# Patient Record
Sex: Male | Born: 1960 | Race: Black or African American | Hispanic: No | Marital: Married | Smoking: Never smoker
Health system: Southern US, Community
[De-identification: ages and names within clinical notes are randomized; demographics above are authoritative.]

## PROBLEM LIST (undated history)

## (undated) DIAGNOSIS — E785 Hyperlipidemia, unspecified: Secondary | ICD-10-CM

## (undated) HISTORY — DX: Hyperlipidemia, unspecified: E78.5

---

## 2011-10-24 ENCOUNTER — Telehealth: Payer: Self-pay | Admitting: *Deleted

## 2011-10-24 ENCOUNTER — Encounter: Payer: Self-pay | Admitting: Family Medicine

## 2011-10-24 ENCOUNTER — Ambulatory Visit (INDEPENDENT_AMBULATORY_CARE_PROVIDER_SITE_OTHER): Payer: Self-pay | Admitting: Family Medicine

## 2011-10-24 ENCOUNTER — Ambulatory Visit (INDEPENDENT_AMBULATORY_CARE_PROVIDER_SITE_OTHER): Payer: BC Managed Care – PPO

## 2011-10-24 VITALS — BP 137/86 | HR 63 | Ht 72.0 in | Wt 216.0 lb

## 2011-10-24 DIAGNOSIS — R351 Nocturia: Secondary | ICD-10-CM | POA: Insufficient documentation

## 2011-10-24 DIAGNOSIS — Z0000 Encounter for general adult medical examination without abnormal findings: Secondary | ICD-10-CM

## 2011-10-24 DIAGNOSIS — M542 Cervicalgia: Secondary | ICD-10-CM

## 2011-10-24 DIAGNOSIS — M5030 Other cervical disc degeneration, unspecified cervical region: Secondary | ICD-10-CM

## 2011-10-24 DIAGNOSIS — N529 Male erectile dysfunction, unspecified: Secondary | ICD-10-CM

## 2011-10-24 LAB — CBC WITH DIFFERENTIAL/PLATELET
Basophils Relative: 1 % (ref 0–1)
HCT: 44.5 % (ref 39.0–52.0)
Hemoglobin: 15.3 g/dL (ref 13.0–17.0)
Lymphs Abs: 1.5 10*3/uL (ref 0.7–4.0)
MCH: 30.5 pg (ref 26.0–34.0)
MCHC: 34.4 g/dL (ref 30.0–36.0)
Monocytes Absolute: 0.3 10*3/uL (ref 0.1–1.0)
Monocytes Relative: 10 % (ref 3–12)
Neutro Abs: 1.3 10*3/uL — ABNORMAL LOW (ref 1.7–7.7)

## 2011-10-24 LAB — HEMOGLOBIN A1C: Mean Plasma Glucose: 108 mg/dL (ref <117)

## 2011-10-24 IMAGING — CR DG CERVICAL SPINE COMPLETE 4+V
5 series · 5 of 5 positions shown · non-contrast
Comparison: None.

CLINICAL DATA: Neck pain for 6 months, no acute injury

CERVICAL SPINE - COMPLETE 4+ VIEW

[view not recorded (1 of 5)]
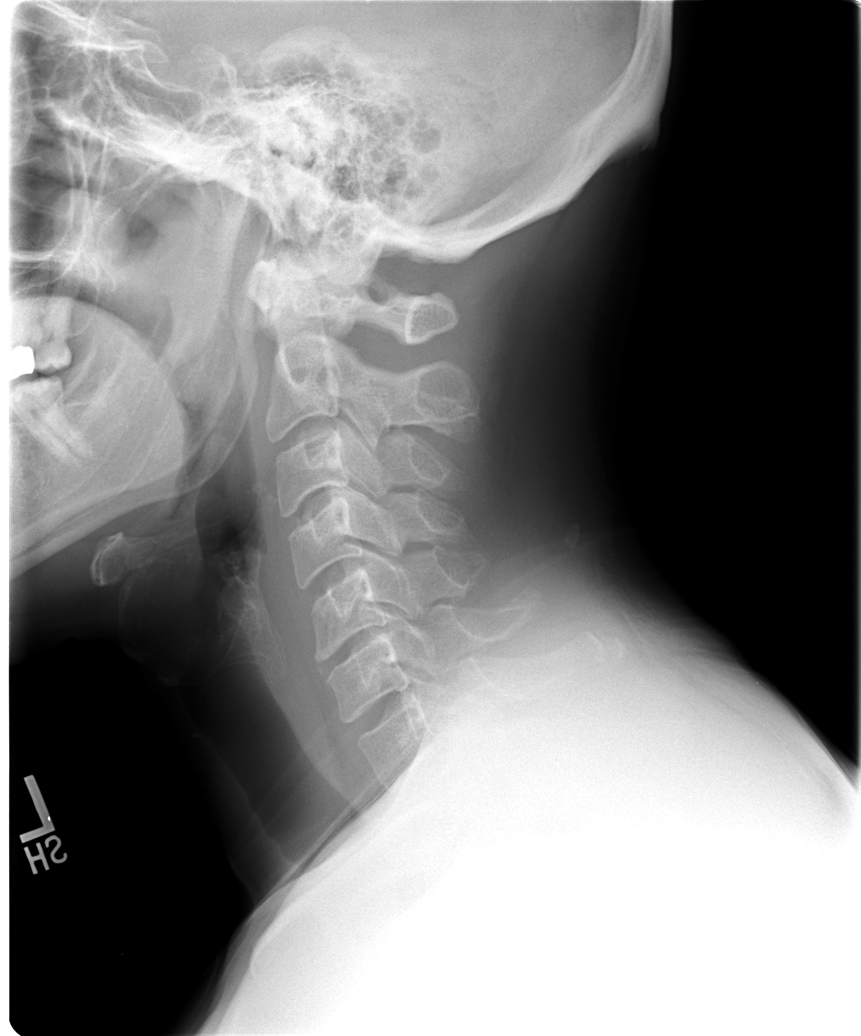

[view not recorded (2 of 5)]
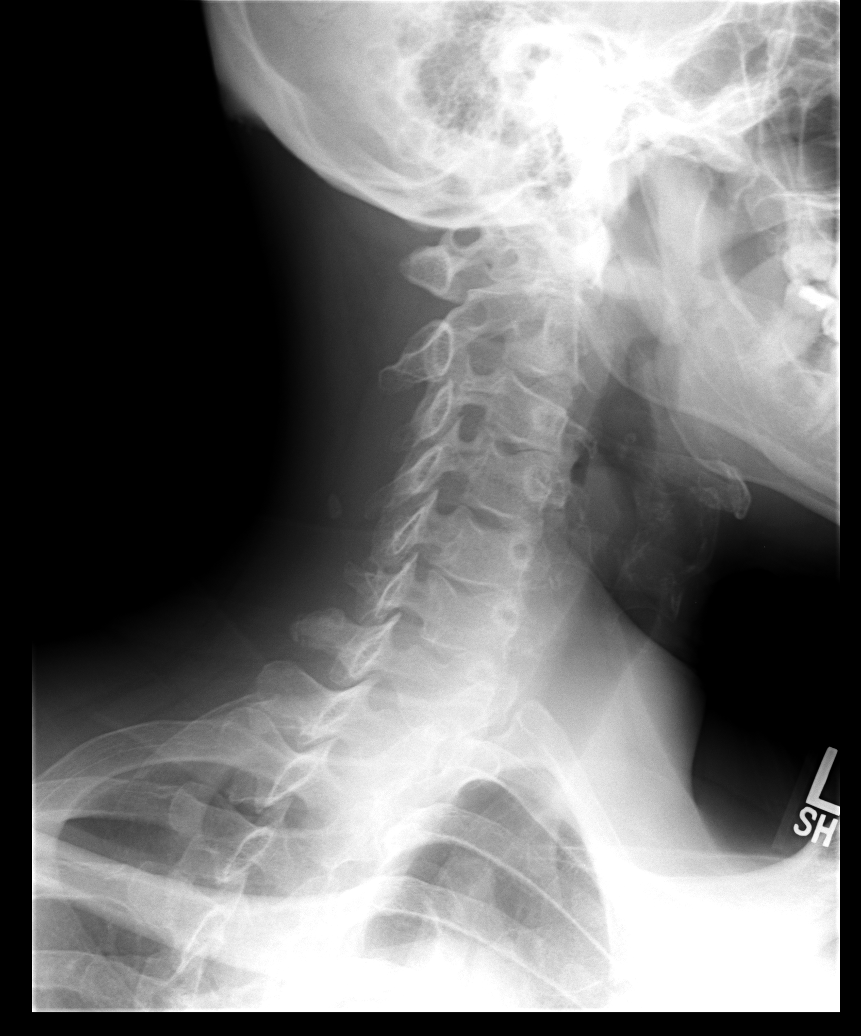

[view not recorded (3 of 5)]
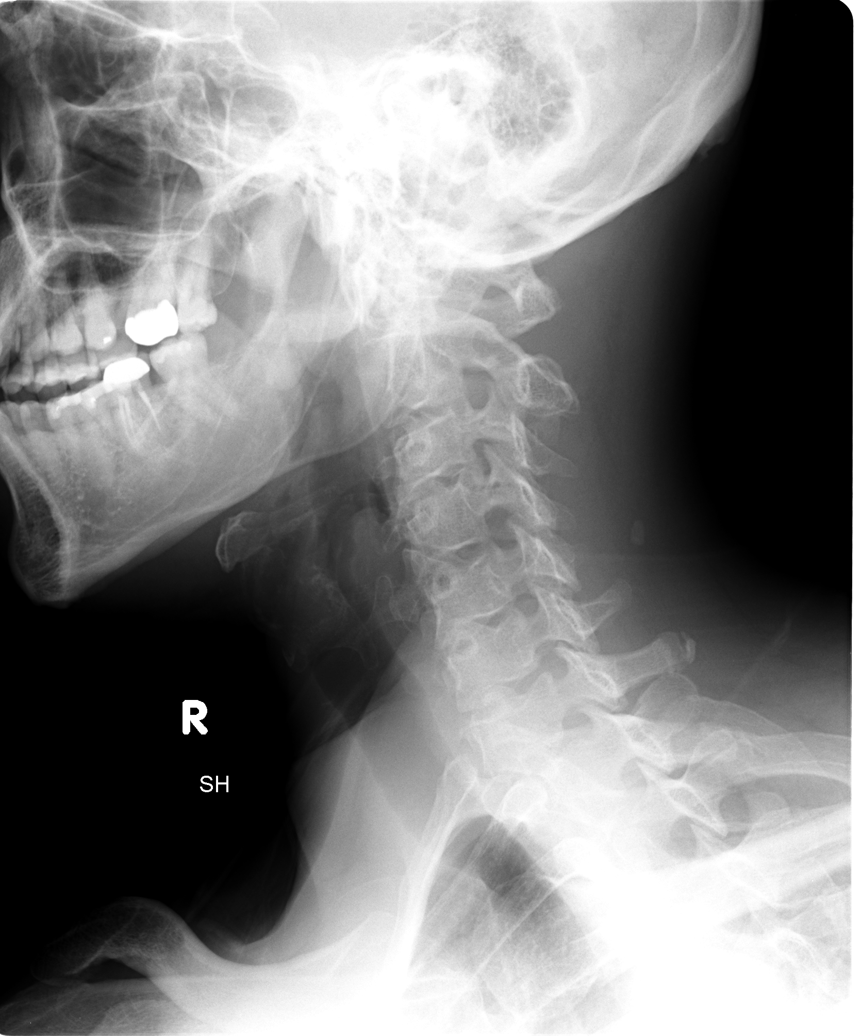

[view not recorded (4 of 5)]
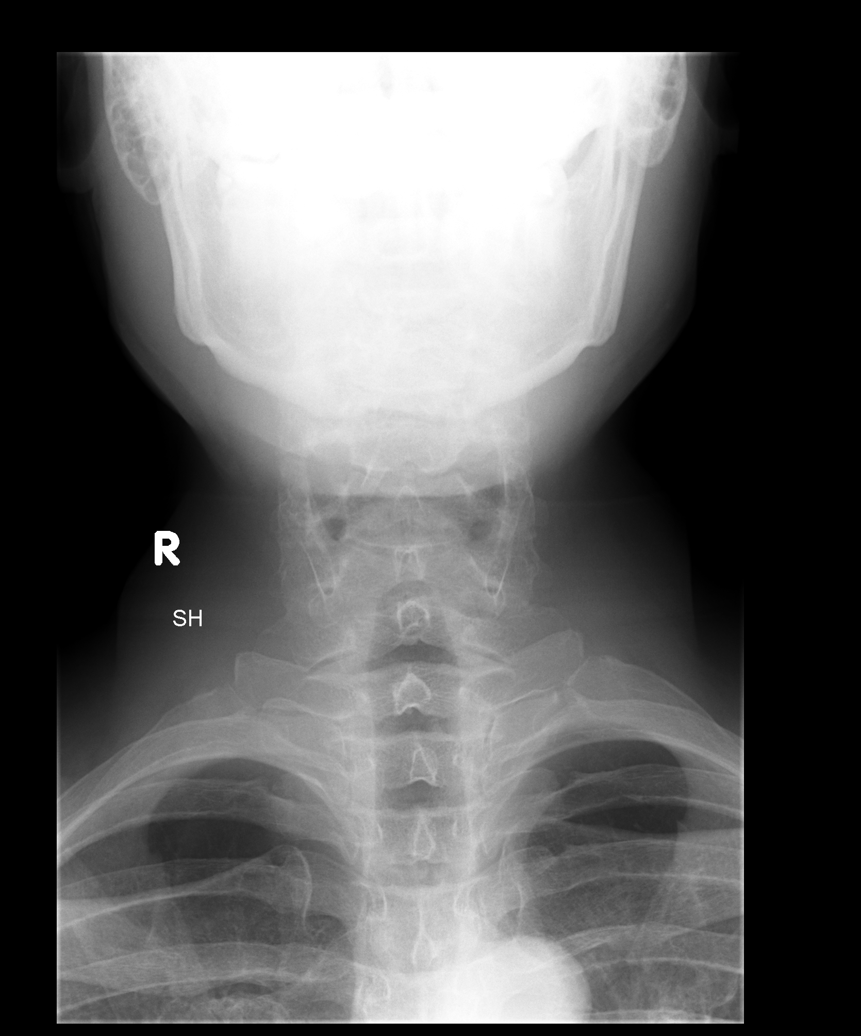

[view not recorded (5 of 5)]
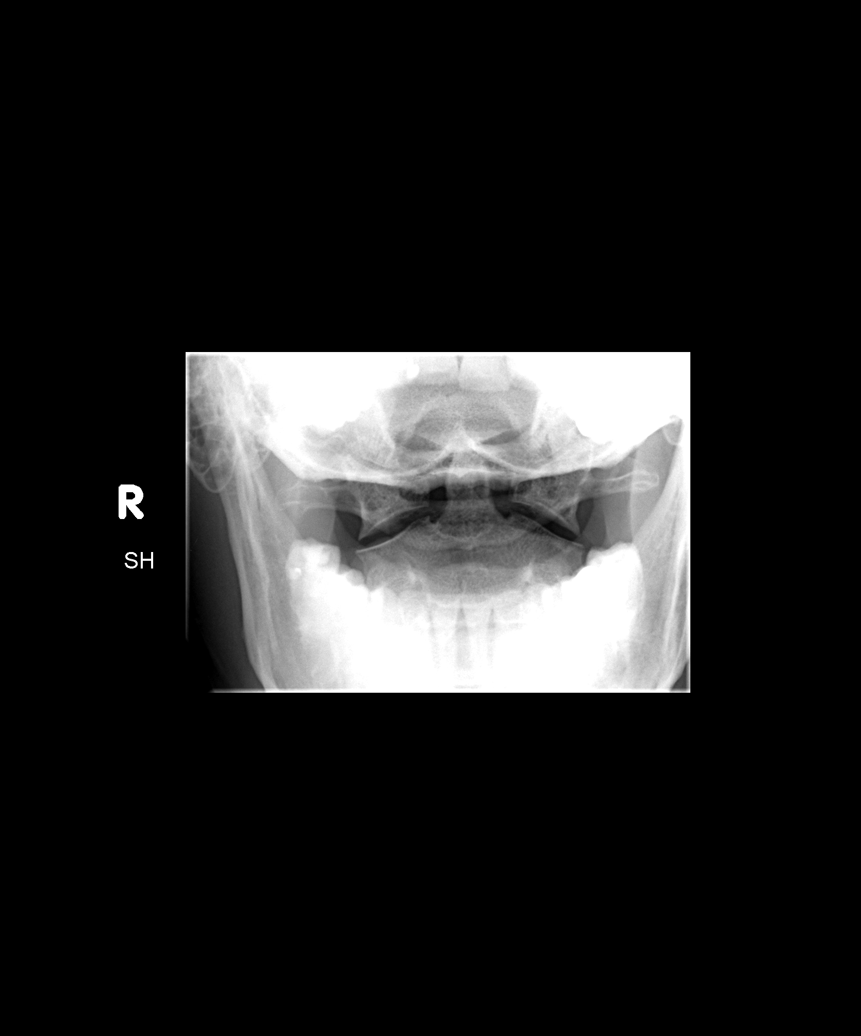

[5 of 5 positions shown; findings below may reference images not displayed]

FINDINGS: The cervical vertebrae are in normal alignment.  There is
mild degenerative disc disease at C5-6 with some loss of disc
space.  No prevertebral soft tissue swelling is seen.  On oblique
views, there is moderate foraminal narrowing at C5-6.  The odontoid
process is intact.  The lung apices are clear.
IMPRESSION: Normal alignment with mild degenerative disc disease at C5-6.

## 2011-10-24 MED ORDER — TADALAFIL 5 MG PO TABS: 5.0000 mg | ORAL_TABLET | Freq: Every day | ORAL | Status: DC | PRN

## 2011-10-24 MED ORDER — MELOXICAM 15 MG PO TABS: 15.0000 mg | ORAL_TABLET | Freq: Every day | ORAL | Status: DC

## 2011-10-24 NOTE — Patient Instructions (Signed)
Erectile Dysfunction Erectile dysfunction (ED) is the inability to get a good enough erection to have sexual intercourse. ED may involve:  Inability to get an erection.   Lack of enough hardness to allow penetration.   Loss of the erection before sex is finished.   Premature ejaculation.   Any combination of these problems if they occur more than 25% of the time.  CAUSES  Certain drugs, such as:   Pain relievers.   Antihistamines.   Antidepressants.   Blood pressure medicines.   Water pills.   Ulcer medicines.   Muscle relaxants.   Illegal drugs.   Excessive drinking.   Psychological causes, such as:   Anxiety.   Depression.   Sadness.   Exhaustion.   Performance fear.   Stress.   Physical causes, such as:   Artery problems. This may include diabetes, smoking, liver disease, or atherosclerosis.   High blood pressure.   Hormonal problems, such as low testosterone.   Obesity.   Nerve problems. This may include back or pelvic injuries, diabetes, multiple sclerosis, Parkinson's disease, or some surgeries.  SYMPTOMS  Inability to get an erection.   Lack of enough hardness to allow penetration.   Loss of the erection before sex is finished.   Premature ejaculation.   Normal erections at some times, but with frequent unsatisfactory episodes.   Orgasms that are not satisfactory in sensation or frequency.   Low sexual satisfaction in either partner because of erection problems.   A curved penis occurring with erection. The curve may cause pain or may be too curved to allow for intercourse.   Never having nighttime erections.  DIAGNOSIS Your caregiver can often diagnose this condition by:  Performing a physical exam to find other diseases or specific problems with the penis.   Asking you detailed questions about the problem.   Performing blood tests to check for diabetes or to measure hormone levels.   Performing urine tests to find other  underlying health conditions.   Performing an ultrasound to check for scarring.   Performing a test to check blood flow to the penis.   Doing a sleep study at home to measure nighttime erections.  TREATMENT   You may be prescribed medicines by mouth.   You may be given medicine injections into the penis.   You may be prescribed a vacuum pump with a ring.   Penile implant surgery may be performed. You may receive:   An inflatable implant.   A semi-rigid implant.   Blood vessel surgery may be performed.  HOME CARE INSTRUCTIONS  Take all medicine as directed by your caregiver. Do not take any other medicines without talking to your caregiver first.   Follow your caregiver's directions for specific treatments as prescribed.   Follow up with your caregiver as directed.  Document Released: 01/19/2000 Document Revised: 01/10/2011 Document Reviewed: 05/13/2010 [facility] Patient Information 2012 [place], [place].Benign Prostatic Hypertrophy  The prostate gland is part of the reproductive system of men. A normal prostate is about the size and shape of a walnut. The prostate gland makes a fluid that is mixed with sperm to make semen. This gland surrounds the urethra and is located in front of the rectum and just below the bladder. The bladder is where urine is stored. The urethra is the tube through which urine passes from the bladder to get out of the body. The prostate grows as a man ages. An enlarged prostate not caused by cancer is called benign prostatic hypertrophy (  BPH). This is a common health problem in men over age 32. This condition is a normal part of aging. An enlarged prostate presses on the urethra. This makes it harder to pass urine. In the early stages of enlargement, the bladder can get by with a narrowed urethra by forcing the urine through. If the problem gets worse, medical or surgical treatment may be required.  This condition should be followed by your caregiver.  Longstanding back pressure on the kidneys can cause infection. Back pressure and infection can progress to bladder damage and kidney (renal) failure. If needed, your caregiver may refer you to a specialist in kidney and prostate disease (urologist). CAUSES  The exact cause is not known.  SYMPTOMS   You are not able to completely empty your bladder.   Getting up often during the night to urinate.   Need to urinate frequently during the day.   Difficultly in starting urine flow.   Decrease in size and strength of the urine stream.   Dribbling after urination.   Pain on urination (more common with infection).   Inability to pass your water. This needs immediate treatment.  DIAGNOSIS  These tests will help your caregiver understand your problem:  Digital rectal exam (DRE). In a rectal exam, your caregiver checks your prostate by putting a gloved, lubricated finger into the rectum to feel the back of your prostate gland. This exam detects the size of the gland and abnormal lumps or growths.   Urinalysis (exam of the urine). This may include a culture if there is concern about infection.   Prostate Specific Antigen (PSA). This is a blood test used to screen for prostate cancer. It is not used alone for diagnosing prostate cancer.   Rectal ultrasound (sonogram). This test uses sound waves to electronically produce a "picture" of the prostate. It helps examine the prostate gland for cancer.  TREATMENT  Mild symptoms may not need treatment. Simple observation and yearly exams may be all that is required. Medications and surgery are options for more severe problems. Your caregiver can help you make an informed decision for what is best. Two classes of medications are available for relief of prostate symptoms:  Medications that shrink the prostate. This helps relieve symptoms.   Uncommon side effects include problems with sexual function.   Medications to relax the muscle of the prostate.  This also relieves the obstruction.   Side effects can include dizziness, fatigue, lightheadedness, and retrograde ejaculation (diminished volume of ejaculate).  Several types of surgical treatments are available for relief of prostate symptoms:  Transurethral resection of the prostate (TURP). In this treatment, an instrument is inserted through opening at the tip of the penis. It is used to cut away pieces of the inner core of the prostate. The pieces are removed through the same opening of the penis. This removes the obstruction and helps get rid of the symptoms.   Transurethral incision (TUIP). In this procedure, small cuts are made in the prostate. This lessens the prostates pressure on the urethra.   Transurethral microwave thermotherapy (TUMT). This procedure uses microwaves to create heat. The heat destroys and removes a small amount of prostate tissue.   Transurethral needle ablation (TUNA). This is a procedure that uses radio frequencies to do the same as TUMT.   Interstitial laser coagulation (ILC). This is a procedure that uses a laser to do the same as TUMT and TUNA.   Transurethral electrovaporization (TUVP). This is a procedure that uses electrodes  to do the same as the procedures listed above.  Regardless of the method of treatment chosen, you and your caregiver will discuss the options. With this knowledge, you along with your caregiver can decide upon the best treatment for you. SEEK MEDICAL CARE IF:   You develop chills, fever of 100.5 F (38.1 C), or night sweats.   There is unexplained back pain.   Symptoms are not helped by medications prescribed.   You develop medication side effects.   Your urine becomes very dark or has a bad smell.  SEEK IMMEDIATE MEDICAL CARE IF:   You are suddenly unable to urinate. This is an emergency. You should be seen immediately.   There are large amounts of blood or clots in the urine.   Your urinary problems become unmanageable.     You develop lightheadedness, severe dizziness, or you feel faint.   You develop moderate to severe low back or flank pain.   You develop chills or fever.  Document Released: 01/21/2005 Document Revised: 01/10/2011 Document Reviewed: 10/13/2006 [facility] Patient Information 2012 [place], [place].

## 2011-10-24 NOTE — Telephone Encounter (Signed)
See lab note; new rx

## 2011-10-24 NOTE — Progress Notes (Signed)
  Subjective:    Patient ID: Ernest Broad., male    DOB: Feb 09, 1960, 51 y.o.   MRN: [id number]  HPI #1 patient's here because of neck pain. He had a deep tissue massage about 6 months ago and since then he's had pain in his neck. He has found sometimes limitations when he is driving turning his head and moving his neck. Occasionally he'll have sharp pains neck as well. Until the massage he denies any neck or back problems.  #2 erectile dysfunction. He is also noted in the last 2 months erectile dysfunction. He reports difficulty obtaining erection and then when he does obtain an erection he's not been able to have satisfactory sexual relations. He denies any medications, increased work hours, or fatigue but he does report going to the bathroom 2-3 times at night indicating nocturia.  Review of Systems  Musculoskeletal: Positive for myalgias and back pain.  All other systems reviewed and are negative.      BP 137/86  Pulse 63  Ht 6' (1.829 m)  Wt 216 lb (97.977 kg)  BMI 29.29 kg/m2  SpO2 100% Objective:   Physical Exam  Vitals reviewed. Constitutional: He is oriented to person, place, and time. He appears well-developed.  HENT:  Head: Normocephalic.  Neck: Neck supple.  Musculoskeletal: Normal range of motion. He exhibits no edema.  Neurological: He is alert and oriented to person, place, and time.  Skin: Skin is warm and dry.  Psychiatric: He has a normal mood and affect. His behavior is normal.      Assessment & Plan:   #1   #1 neck pain. Will obtain C-spine x-ray and an ongoing to recommend chiropractic referral for manipulation of the spine and if that is not effective then ask her medication for pain.  #2 nocturia by history/erectile dysfunction. We'll try him on Cialis low dose 5 mg daily voucher given. Return in 6 weeks for complete physical. We'll obtain PSA, TSH, testosterone, CMP, CBC and lipid and A1c as well.

## 2011-10-25 LAB — COMPREHENSIVE METABOLIC PANEL
Albumin: 4.7 g/dL (ref 3.5–5.2)
BUN: 11 mg/dL (ref 6–23)
Calcium: 9.7 mg/dL (ref 8.4–10.5)
Chloride: 102 mEq/L (ref 96–112)
Glucose, Bld: 85 mg/dL (ref 70–99)
Potassium: 4.2 mEq/L (ref 3.5–5.3)

## 2011-10-25 LAB — TESTOSTERONE, FREE, TOTAL, SHBG
Sex Hormone Binding: 65 nmol/L (ref 13–71)
Testosterone, Free: 70.6 pg/mL (ref 47.0–244.0)
Testosterone: 534.34 ng/dL (ref 300–890)

## 2011-10-25 LAB — LIPID PANEL
Cholesterol: 217 mg/dL — ABNORMAL HIGH (ref 0–200)
LDL Cholesterol: 147 mg/dL — ABNORMAL HIGH (ref 0–99)
Triglycerides: 75 mg/dL (ref <150)

## 2011-10-25 LAB — PSA, TOTAL AND FREE
PSA, Free Pct: 45 % (ref >25)
PSA, Free: 0.13 ng/mL

## 2011-12-05 ENCOUNTER — Encounter: Payer: Self-pay | Admitting: Family Medicine

## 2011-12-05 ENCOUNTER — Ambulatory Visit (INDEPENDENT_AMBULATORY_CARE_PROVIDER_SITE_OTHER): Payer: BC Managed Care – PPO | Admitting: Family Medicine

## 2011-12-05 VITALS — BP 132/76 | HR 77 | Ht 72.0 in | Wt 218.0 lb

## 2011-12-05 DIAGNOSIS — S5010XA Contusion of unspecified forearm, initial encounter: Secondary | ICD-10-CM

## 2011-12-05 DIAGNOSIS — R351 Nocturia: Secondary | ICD-10-CM

## 2011-12-05 DIAGNOSIS — Z283 Underimmunization status: Secondary | ICD-10-CM

## 2011-12-05 MED ORDER — TADALAFIL 2.5 MG PO TABS: 2.5000 mg | ORAL_TABLET | Freq: Every day | ORAL | Status: DC

## 2011-12-05 NOTE — Progress Notes (Signed)
Subjective:    Patient ID: Ernest Broad., male    DOB: [birth year], 51 y.o.   MRN: [id number]  HPI #1 he reports nocturia has improved. Instead of going to the bathroom 3-4 times at night he reports only having to go once at night. He states that his wife saw that the Cialis could be dosed at 2.5 and he would like to try the lower dosage. Also he reports the erectile difficulty that he had has also improved. It should be noted he was to have a physical exam today but somehow has been a miscommunication and we'll try to get him back in so we can make sure his prostate is okay.  #2 left forearm pain. He had blood drawn at work and noticed pain in his left forearm interfering with his work outs and daily activities it has gotten better.  #3 immunization update. He has had update on his tetanus and colonoscopy entered. Still is due his flu shot.  #4 abnormal labs. His cholesterol was slightly elevated and his white count was low as well. Review of Systems BP 132/76  Pulse 77  Ht 6' (1.829 m)  Wt 218 lb (98.884 kg)  BMI 29.57 kg/m2  SpO2 100%    Objective:   Physical Exam  Vitals reviewed. Constitutional: He appears well-developed and well-nourished.  HENT:  Head: Normocephalic.  Musculoskeletal: Normal range of motion. He exhibits tenderness.       Mild tenderness over the left forearm no signs of hematoma or bleeding at this time  Neurological: He is alert.  Skin: Skin is warm and dry.  Psychiatric: He has a normal mood and affect. His behavior is normal. Thought content normal.       Results for orders placed in visit on 10/24/11  COMPREHENSIVE METABOLIC PANEL      Component Value Range   Sodium 139  135 - 145 mEq/L   Potassium 4.2  3.5 - 5.3 mEq/L   Chloride 102  96 - 112 mEq/L   CO2 25  19 - 32 mEq/L   Glucose, Bld 85  70 - 99 mg/dL   BUN 11  6 - 23 mg/dL   Creat 4.09  8.11 - 9.14 mg/dL   Total Bilirubin 0.7  0.3 - 1.2 mg/dL   Alkaline Phosphatase 59  39 - 117 U/L   AST 40 (*) 0 - 37 U/L   ALT 26  0 - 53 U/L   Total Protein 7.6  6.0 - 8.3 g/dL   Albumin 4.7  3.5 - 5.2 g/dL   Calcium 9.7  8.4 - 78.2 mg/dL  TSH      Component Value Range   TSH 1.342  0.350 - 4.500 uIU/mL  LIPID PANEL      Component Value Range   Cholesterol 217 (*) 0 - 200 mg/dL   Triglycerides 75  <956 mg/dL   HDL 55  >21 mg/dL   Total CHOL/HDL Ratio 3.9     VLDL 15  0 - 40 mg/dL   LDL Cholesterol 308 (*) 0 - 99 mg/dL  CBC WITH DIFFERENTIAL      Component Value Range   WBC 3.3 (*) 4.0 - 10.5 K/uL   RBC 5.01  4.22 - 5.81 MIL/uL   Hemoglobin 15.3  13.0 - 17.0 g/dL   HCT 65.7  84.6 - 96.2 %   MCV 88.8  78.0 - 100.0 fL   MCH 30.5  26.0 - 34.0 pg   MCHC 34.4  30.0 -  36.0 g/dL   RDW 16.1  09.6 - 04.5 %   Platelets 256  150 - 400 K/uL   Neutrophils Relative 40 (*) 43 - 77 %   Neutro Abs 1.3 (*) 1.7 - 7.7 K/uL   Lymphocytes Relative 45  12 - 46 %   Lymphs Abs 1.5  0.7 - 4.0 K/uL   Monocytes Relative 10  3 - 12 %   Monocytes Absolute 0.3  0.1 - 1.0 K/uL   Eosinophils Relative 4  0 - 5 %   Eosinophils Absolute 0.1  0.0 - 0.7 K/uL   Basophils Relative 1  0 - 1 %   Basophils Absolute 0.0  0.0 - 0.1 K/uL   Smear Review Criteria for review not met    PSA, TOTAL AND FREE      Component Value Range   PSA 0.29  <=4.00 ng/mL   PSA, Free 0.13     PSA, Free Pct 45  >25 %  TESTOSTERONE, FREE, TOTAL      Component Value Range   Testosterone 534.34  300 - 890 ng/dL   Sex Hormone Binding 65  13 - 71 nmol/L   Testosterone, Free 70.6  47.0 - 244.0 pg/mL   Testosterone-% Freee. 1.3 (*) 1.6 - 2.9 %  HEMOGLOBIN A1C      Component Value Range   Hemoglobin A1C 5.4  <5.7 %   Mean Plasma Glucose 108  <117 mg/dL   Assessment & Plan:  #1 Return for PE. #2 iimmunization update decline flu shot and also declines tetanus and pertussis immunization as well. #3 prostatic hyperplasia improved Cialis renewed at 2.5 mg dosage with instructions to take one to 2 tablets a day. #4 bruising  subcutaneous tissue and right forearm probably from minor bleeding. Initially explained I would recommend ice but says is improving and is 3-4 weeks out no further therapy should be needed. #5 abnormal labs. Patient's complaint to me his white count is usually low as would not do anything different with that and while his cholesterol is slightly elevated is HDL is good at a ratio of HDL cholesterol is still under 4 so would not recommend medication at this time.  Patient informed of provider departure in December

## 2011-12-05 NOTE — Patient Instructions (Signed)
Influenza Virus Trivalent Vaccine injection (Fluzone/FluLaval/Fluvirin/Agriflu) What is this medicine? INFLUENZA VIRUS VACCINE (in floo EN zuh VAHY ruhs vak SEEN) helps to reduce the risk of getting influenza also known as the flu. The vaccine only helps protect you against some strains of the flu. This medicine may be used for other purposes; ask your health care provider or pharmacist if you have questions. What should I tell my health care provider before I take this medicine? They need to know if you have any of these conditions: -bleeding disorder like hemophilia -fever or infection -Guillain-Barre syndrome or other neurological problems -immune system problems -infection with the human immunodeficiency virus (HIV) or AIDS -low blood platelet counts -multiple sclerosis -an unusual or allergic reaction to influenza virus vaccine, eggs, chicken proteins, thimerosal, other medicines, foods, dyes or preservatives -pregnant or trying to get pregnant -breast-feeding How should I use this medicine? This vaccine is for injection into a muscle or under the skin. It is given by a health care professional. A copy of Vaccine Information Statements will be given before each vaccination. Read this sheet carefully each time. The sheet may change frequently. Talk to your pediatrician regarding the use of this medicine in children. Special care may be needed. While some brands of this drug may be prescribed for children as young as 6 months of age for selected conditions, precautions do apply. Overdosage: If you think you have taken too much of this medicine contact a poison control center or emergency room at once. NOTE: This medicine is only for you. Do not share this medicine with others. What if I miss a dose? This does not apply. What may interact with this medicine? -chemotherapy or radiation therapy -medicines that lower your immune system like etanercept, anakinra, infliximab, and  adalimumab -medicines that treat or prevent blood clots like warfarin -phenytoin -steroid medicines like prednisone or cortisone -theophylline -vaccines This list may not describe all possible interactions. Give your health care provider a list of all the medicines, herbs, non-prescription drugs, or dietary supplements you use. Also tell them if you smoke, drink alcohol, or use illegal drugs. Some items may interact with your medicine. What should I watch for while using this medicine? Report any side effects that do not go away within 3 days to your doctor or health care professional. Call your health care provider if any unusual symptoms occur within 6 weeks of receiving this vaccine. You may still catch the flu, but the illness is not usually as bad. You cannot get the flu from the vaccine. The vaccine will not protect against colds or other illnesses that may cause fever. The vaccine is needed every year. What side effects may I notice from receiving this medicine? Side effects that you should report to your doctor or health care professional as soon as possible: -allergic reactions like skin rash, itching or hives, swelling of the face, lips, or tongue Side effects that usually do not require medical attention (report to your doctor or health care professional if they continue or are bothersome): -fever -headache -muscle aches and pains -pain, tenderness, redness, or swelling at the injection site -tiredness This list may not describe all possible side effects. Call your doctor for medical advice about side effects. You may report side effects to FDA at 1-800-FDA-1088. Where should I keep my medicine? The vaccine will be given by a health care professional in a clinic, pharmacy, doctor's office, or other health care setting. You will not be given vaccine doses to store   at home. NOTE: This sheet is a summary. It may not cover all possible information. If you have questions about this  medicine, talk to your doctor, pharmacist, or health care provider.  2012, Elsevier/Gold Standard. (06/21/2009 10:47:37 AM)Diphtheria, Tetanus, and Pertussis (DTaP) Vaccine What You Need to Know WHY GET VACCINATED? Diphtheria, tetanus, and pertussis are serious diseases caused by bacteria. Diphtheria and pertussis are spread from person to person. Tetanus enters the body through cuts or wounds. Diphtheria causes a thick covering in the back of the throat.  It can lead to breathing problems, paralysis, heart failure, and even death. Tetanus (Lockjaw) causes painful tightening of the muscles, usually all over the body.  It can lead to "locking" of the jaw so the victim cannot open his or her mouth or swallow. Tetanus leads to death in about 2 out of 10 cases. Pertussis (Whooping Cough) causes coughing spells so bad that it is hard for infants to eat, drink, or breathe. These spells can last for weeks.  It can lead to pneumonia, seizures (jerking and staring spells), brain damage, and death. Diphtheria, tetanus, and pertussis vaccine (DTaP) can help prevent these diseases. Most children who are vaccinated with DTaP will be protected throughout childhood. Many more children would get these diseases if we stopped vaccinating. DTaP is a safer version of an older vaccine called DTP. DTP is no longer used in the [place]. WHO SHOULD GET DTAP VACCINE AND WHEN? Children should get 5 doses of DTaP vaccine, 1 dose at each of the following ages:  2 months.  4 months.  6 months.  15 to 18 months.  4 to 6 years. DTaP may be given at the same time as other vaccines. SOME CHILDREN SHOULD NOT GET DTAP VACCINE OR SHOULD WAIT  Children with minor illnesses, such as a cold, may be vaccinated. But children who are moderately or severely ill should usually wait until they recover before getting DTaP vaccine.  Any child who had a life-threatening allergic reaction after a dose of DTaP should not get  another dose.  Any child who suffered a brain or nervous system disease within 7 days after a dose of DTaP should not get another dose.  Talk with your caregiver if your child:  Had a seizure or collapsed after a dose of DTaP.  Cried non-stop for 3 hours or more after a dose of DTaP.  Had a fever over 105 F (40.6 C) after a dose of DTaP.  Ask your caregiver for more information. Some of these children should not get another dose of pertussis vaccine, but may get a vaccine without pertussis, called DT. OLDER CHILDREN AND ADULTS  DTaP is not licensed for adolescents, adults, or children 95 years of age and older.  But older people still need protection. A vaccine called Tdap is similar to DTaP. A single dose of Tdap is recommended for people 11 through 52 years of age. Another vaccine, called Td, protects against tetanus and diphtheria, but not pertussis. It is recommended every 10 years. WHAT ARE THE RISKS FROM DTAP VACCINE?  Getting diphtheria, tetanus, or pertussis disease is much riskier than getting DTaP vaccine.  However, a vaccine, like any medicine, is capable of causing serious problems, such as severe allergic reactions. The risk of DTaP vaccine causing serious harm, or death, is extremely small. Mild Problems (Common)  Fever (up to about 1 child in 4).  Redness or swelling where the shot was given (up to about 1 child in 4).  Soreness or tenderness where the shot was given (up to about 1 child in 4). These problems occur more often after the 4th and 5th doses of the DTaP series than after earlier doses. Sometimes the 4th or 5th dose of DTaP vaccine is followed by swelling of the entire arm or leg in which the shot was given, lasting 1 to 7 days (up to about 1 child in 30). Other mild problems include:  Fussiness (up to about 1 child in 3).  Tiredness or poor appetite (up to about 1 child in 10).  Vomiting (up to about 1 child in 50). These problems generally occur 1 to  3 days after the shot. Moderate Problems (Uncommon)  Seizure (jerking or staring) (about 1 child out of 14,000).  Non-stop crying, for 3 hours or more (up to about 1 child out of 1,000).  High fever, over 105 F (40.6 C) (about 1 child out of 16,000). Severe Problems (Very Rare)  Serious allergic reaction (less than 1 out of a million doses).  Several other severe problems have been reported after DTaP vaccine. These include:  Long-term seizures, coma, or lowered consciousness.  Permanent brain damage. These are so rare it is hard to tell if they are caused by the vaccine. Controlling fever is especially important for children who have had seizures, for any reason. It is also important if another family member has had seizures. You can reduce fever and pain by giving your child an aspirin-free pain reliever when the shot is given, and for the next 24 hours, following the package instructions. WHAT IF THERE IS A MODERATE OR SEVERE REACTION? What should I look for? Any unusual conditions, such as a serious allergic reaction, high fever, or unusual behavior. Serious allergic reactions are extremely rare with any vaccine. If one were to occur, it would most likely be within a few minutes to a few hours after the shot. Signs can include difficulty breathing, hoarseness or wheezing, hives, paleness, weakness, a fast heartbeat, or dizziness. If a high fever or seizure were to occur, it would usually be within a week after the shot. What should I do?  Call your caregiver or get the person to a caregiver right away.  Tell the caregiver what happened, the date and time it happened, and when the vaccination was given.  Ask the caregiver, nurse, or health department to file a Vaccine Adverse Event Reporting System (VAERS) form. Or, you can file this report through the VAERS website at www.vaers.LAgents.no or by calling 1-563-099-3371. VAERS does not provide medical advice. THE NATIONAL VACCINE INJURY  COMPENSATION PROGRAM  In the rare event that you or your child has a serious reaction to a vaccine, a federal program has been created to help you pay for the care of those who have been harmed.  For details about the National Vaccine Injury Compensation Program, call 989-563-9179 or visit the program's website at SpiritualWord.at HOW CAN I LEARN MORE?  Ask your caregiver. They can give you the vaccine package insert or suggest other sources of information.  Call your local or state health department's immunization program.  Contact the Centers for Disease Control and Prevention (CDC):  Call (780)555-7371 (1-800-CDC-INFO).  Visit the [employer] at PicCapture.uy CDC Diphtheria, Tetanus, and Pertussis (DTaP) Vaccine VIS (06/20/05) Document Released: 11/18/2005 Document Revised: 04/15/2011 Document Reviewed: 11/18/2005 [facility] Patient Information 2013 [place], [place].

## 2011-12-13 ENCOUNTER — Telehealth: Payer: Self-pay | Admitting: *Deleted

## 2011-12-13 NOTE — Telephone Encounter (Signed)
PA obtained for Cialis 2.5mg  Qty: 60. PA was approved and good from 11/13/2011 to 12/12/2012. Informed [facility] and [facility] for pt to go to pharmacy.

## 2011-12-13 NOTE — Telephone Encounter (Signed)
Approval # for Cialis 2.5mg  Qty: 60 is [id number]

## 2011-12-19 ENCOUNTER — Ambulatory Visit (INDEPENDENT_AMBULATORY_CARE_PROVIDER_SITE_OTHER): Payer: BC Managed Care – PPO | Admitting: Family Medicine

## 2011-12-19 ENCOUNTER — Encounter: Payer: Self-pay | Admitting: Family Medicine

## 2011-12-19 VITALS — BP 130/80 | HR 74 | Ht 72.0 in | Wt 215.0 lb

## 2011-12-19 DIAGNOSIS — Z1211 Encounter for screening for malignant neoplasm of colon: Secondary | ICD-10-CM

## 2011-12-19 DIAGNOSIS — Z136 Encounter for screening for cardiovascular disorders: Secondary | ICD-10-CM

## 2011-12-19 DIAGNOSIS — Z0000 Encounter for general adult medical examination without abnormal findings: Secondary | ICD-10-CM

## 2011-12-19 DIAGNOSIS — Z23 Encounter for immunization: Secondary | ICD-10-CM

## 2011-12-19 LAB — HEMOCCULT GUIAC POC 1CARD (OFFICE): Fecal Occult Blood, POC: NEGATIVE

## 2011-12-19 LAB — POCT URINALYSIS DIPSTICK
Bilirubin, UA: NEGATIVE
Blood, UA: NEGATIVE
Glucose, UA: NEGATIVE
Nitrite, UA: NEGATIVE

## 2011-12-19 MED ORDER — TADALAFIL 2.5 MG PO TABS: 2.5000 mg | ORAL_TABLET | Freq: Every day | ORAL | Status: DC

## 2011-12-19 NOTE — Patient Instructions (Signed)
Health Maintenance, Males A healthy lifestyle and preventative care can promote health and wellness.  Maintain regular health, dental, and eye exams.  Eat a healthy diet. Foods like vegetables, fruits, whole grains, low-fat dairy products, and lean protein foods contain the nutrients you need without too many calories. Decrease your intake of foods high in solid fats, added sugars, and salt. Get information about a proper diet from your caregiver, if necessary.  Regular physical exercise is one of the most important things you can do for your health. Most adults should get at least 150 minutes of moderate-intensity exercise (any activity that increases your heart rate and causes you to sweat) each week. In addition, most adults need muscle-strengthening exercises on 2 or more days a week.   Maintain a healthy weight. The body mass index (BMI) is a screening tool to identify possible weight problems. It provides an estimate of body fat based on height and weight. Your caregiver can help determine your BMI, and can help you achieve or maintain a healthy weight. For adults 20 years and older:  A BMI below 18.5 is considered underweight.  A BMI of 18.5 to 24.9 is normal.  A BMI of 25 to 29.9 is considered overweight.  A BMI of 30 and above is considered obese.  Maintain normal blood lipids and cholesterol by exercising and minimizing your intake of saturated fat. Eat a balanced diet with plenty of fruits and vegetables. Blood tests for lipids and cholesterol should begin at age 20 and be repeated every 5 years. If your lipid or cholesterol levels are high, you are over 50, or you are a high risk for heart disease, you may need your cholesterol levels checked more frequently.Ongoing high lipid and cholesterol levels should be treated with medicines, if diet and exercise are not effective.  If you smoke, find out from your caregiver how to quit. If you do not use tobacco, do not start.  If you  choose to drink alcohol, do not exceed 2 drinks per day. One drink is considered to be 12 ounces (355 mL) of beer, 5 ounces (148 mL) of wine, or 1.5 ounces (44 mL) of liquor.  Avoid use of street drugs. Do not share needles with anyone. Ask for help if you need support or instructions about stopping the use of drugs.  High blood pressure causes heart disease and increases the risk of stroke. Blood pressure should be checked at least every 1 to 2 years. Ongoing high blood pressure should be treated with medicines if weight loss and exercise are not effective.  If you are 45 to 52 years old, ask your caregiver if you should take aspirin to prevent heart disease.  Diabetes screening involves taking a blood sample to check your fasting blood sugar level. This should be done once every 3 years, after age 45, if you are within normal weight and without risk factors for diabetes. Testing should be considered at a younger age or be carried out more frequently if you are overweight and have at least 1 risk factor for diabetes.  Colorectal cancer can be detected and often prevented. Most routine colorectal cancer screening begins at the age of 50 and continues through age 75. However, your caregiver may recommend screening at an earlier age if you have risk factors for colon cancer. On a yearly basis, your caregiver may provide home test kits to check for hidden blood in the stool. Use of a small camera at the end of a tube,   to directly examine the colon (sigmoidoscopy or colonoscopy), can detect the earliest forms of colorectal cancer. Talk to your caregiver about this at age 85, when routine screening begins. Direct examination of the colon should be repeated every 5 to 10 years through age 41, unless early forms of pre-cancerous polyps or small growths are found.  Hepatitis C blood testing is recommended for all people born from [birth year] through 1966 and any individual with known risks for hepatitis C.  Healthy  men should no longer receive prostate-specific antigen (PSA) blood tests as part of routine cancer screening. Consult with your caregiver about prostate cancer screening.  Testicular cancer screening is not recommended for adolescents or adult males who have no symptoms. Screening includes self-exam, caregiver exam, and other screening tests. Consult with your caregiver about any symptoms you have or any concerns you have about testicular cancer.  Practice safe sex. Use condoms and avoid high-risk sexual practices to reduce the spread of sexually transmitted infections (STIs).  Use sunscreen with a sun protection factor (SPF) of 30 or greater. Apply sunscreen liberally and repeatedly throughout the day. You should seek shade when your shadow is shorter than you. Protect yourself by wearing long sleeves, pants, a wide-brimmed hat, and sunglasses year round, whenever you are outdoors.  Notify your caregiver of new moles or changes in moles, especially if there is a change in shape or color. Also notify your caregiver if a mole is larger than the size of a pencil eraser.  A one-time screening for abdominal aortic aneurysm (AAA) and surgical repair of large AAAs by sound wave imaging (ultrasonography) is recommended for ages 70 to 35 years who are current or former smokers.  Stay current with your immunizations. Document Released: 07/20/2007 Document Revised: 04/15/2011 Document Reviewed: 06/18/2010 [facility] Patient Information 2013 [place], [place]. h

## 2011-12-19 NOTE — Progress Notes (Signed)
Subjective:    Patient ID: Ernest Nguyen., male    DOB: Feb 09, 1960, 51 y.o.   MRN: [id number]  HPI  Yearly  Examination  #2 nocturia doing better.  #3 immunization due. Needs flu vaccination and tetanus update.  Review of Systems  BP 130/80  Pulse 74  Ht 6' (1.829 m)  Wt 215 lb (97.523 kg)  BMI 29.16 kg/m2 No Known Allergies History   Social History  . Marital Status: Married    Spouse Name: N/A    Number of Children: N/A  . Years of Education: N/A   Occupational History  . Not on file.   Social History Main Topics  . Smoking status: Never Smoker   . Smokeless tobacco: Never Used  . Alcohol Use: 8.4 oz/week    7 Glasses of wine, 7 Cans of beer per week  . Drug Use: No  . Sexually Active: Yes   Other Topics Concern  . Not on file   Social History Narrative  . No narrative on file   Family History  Problem Relation Age of Onset  . Stroke Mother   . Alcohol abuse Father   . Seizures Father    History reviewed. No pertinent past medical history. History reviewed. No pertinent past surgical history.     BP 130/80  Pulse 74  Ht 6' (1.829 m)  Wt 215 lb (97.523 kg)  BMI 29.16 kg/m2 Objective:   Physical Exam  Vitals reviewed. Constitutional: He is oriented to person, place, and time. He appears well-developed and well-nourished.  HENT:  Head: Normocephalic and atraumatic.  Eyes: Conjunctivae normal and EOM are normal. Pupils are equal, round, and reactive to light. Right eye exhibits no discharge. Left eye exhibits no discharge.  Fundoscopic exam:      The right eye shows no arteriolar narrowing, no AV nicking, no exudate and no hemorrhage.       The left eye shows no arteriolar narrowing, no AV nicking, no exudate and no hemorrhage.  Neck: Normal range of motion. Neck supple.  Cardiovascular: Normal rate and regular rhythm.  Exam reveals no gallop and no friction rub.   No murmur heard. Pulmonary/Chest: Effort normal and breath sounds normal. No  respiratory distress. He has no wheezes.  Abdominal: Soft. Bowel sounds are normal. He exhibits no distension. There is no tenderness. Hernia confirmed negative in the right inguinal area and confirmed negative in the left inguinal area.  Genitourinary: Rectum normal, prostate normal, testes normal and penis normal. Rectal exam shows no fissure, no mass, no tenderness and anal tone normal. Guaiac negative stool. Prostate is not enlarged and not tender. Right testis shows no mass and no tenderness. Left testis shows no mass and no tenderness. Circumcised. No penile tenderness.  Musculoskeletal: Normal range of motion. He exhibits no edema and no tenderness.  Neurological: He is alert and oriented to person, place, and time. He has normal reflexes. No cranial nerve deficit.  Skin: Skin is warm and dry. No erythema.  Psychiatric: He has a normal mood and affect. His behavior is normal.   EKG normal sinus rhythm.   Results for orders placed in visit on 10/24/11  COMPREHENSIVE METABOLIC PANEL      Component Value Range   Sodium 139  135 - 145 mEq/L   Potassium 4.2  3.5 - 5.3 mEq/L   Chloride 102  96 - 112 mEq/L   CO2 25  19 - 32 mEq/L   Glucose, Bld 85  70 - 99 mg/dL  BUN 11  6 - 23 mg/dL   Creat 4.09  8.11 - 9.14 mg/dL   Total Bilirubin 0.7  0.3 - 1.2 mg/dL   Alkaline Phosphatase 59  39 - 117 U/L   AST 40 (*) 0 - 37 U/L   ALT 26  0 - 53 U/L   Total Protein 7.6  6.0 - 8.3 g/dL   Albumin 4.7  3.5 - 5.2 g/dL   Calcium 9.7  8.4 - 78.2 mg/dL  TSH      Component Value Range   TSH 1.342  0.350 - 4.500 uIU/mL  LIPID PANEL      Component Value Range   Cholesterol 217 (*) 0 - 200 mg/dL   Triglycerides 75  <956 mg/dL   HDL 55  >21 mg/dL   Total CHOL/HDL Ratio 3.9     VLDL 15  0 - 40 mg/dL   LDL Cholesterol 308 (*) 0 - 99 mg/dL  CBC WITH DIFFERENTIAL      Component Value Range   WBC 3.3 (*) 4.0 - 10.5 K/uL   RBC 5.01  4.22 - 5.81 MIL/uL   Hemoglobin 15.3  13.0 - 17.0 g/dL   HCT 65.7   84.6 - 96.2 %   MCV 88.8  78.0 - 100.0 fL   MCH 30.5  26.0 - 34.0 pg   MCHC 34.4  30.0 - 36.0 g/dL   RDW 95.2  84.1 - 32.4 %   Platelets 256  150 - 400 K/uL   Neutrophils Relative 40 (*) 43 - 77 %   Neutro Abs 1.3 (*) 1.7 - 7.7 K/uL   Lymphocytes Relative 45  12 - 46 %   Lymphs Abs 1.5  0.7 - 4.0 K/uL   Monocytes Relative 10  3 - 12 %   Monocytes Absolute 0.3  0.1 - 1.0 K/uL   Eosinophils Relative 4  0 - 5 %   Eosinophils Absolute 0.1  0.0 - 0.7 K/uL   Basophils Relative 1  0 - 1 %   Basophils Absolute 0.0  0.0 - 0.1 K/uL   Smear Review Criteria for review not met    PSA, TOTAL AND FREE      Component Value Range   PSA 0.29  <=4.00 ng/mL   PSA, Free 0.13     PSA, Free Pct 45  >25 %  TESTOSTERONE, FREE, TOTAL      Component Value Range   Testosterone 534.34  300 - 890 ng/dL   Sex Hormone Binding 65  13 - 71 nmol/L   Testosterone, Free 70.6  47.0 - 244.0 pg/mL   Testosterone-% Freee. 1.3 (*) 1.6 - 2.9 %  HEMOGLOBIN A1C      Component Value Range   Hemoglobin A1C 5.4  <5.7 %   Mean Plasma Glucose 108  <117 mg/dL      Assessment & Plan:  #1 health maintenance return in about 18 months followup. #2 nocturia. Cialis 2.5-5 mg work well sample pack 5 mg #30 given to him as well for when he feels that he needs a little extra Cialis for this extra special moments. #3 immunization needs. He will get his tetanus immunization. He still declines flu

## 2011-12-23 ENCOUNTER — Encounter: Payer: Self-pay | Admitting: *Deleted

## 2011-12-24 ENCOUNTER — Ambulatory Visit: Payer: BC Managed Care – PPO | Admitting: Family Medicine

## 2012-01-21 ENCOUNTER — Telehealth: Payer: Self-pay | Admitting: *Deleted

## 2012-01-21 NOTE — Telephone Encounter (Signed)
Pt called requesting 90 day rx of Cialis. Per Dr Thurmond Butts called Cialas 2.5mg  1-2 po qd. # 180/3 [facility] [phone]). Pts wife notified.

## 2012-06-17 ENCOUNTER — Telehealth: Payer: Self-pay | Admitting: Family Medicine

## 2012-06-17 DIAGNOSIS — N529 Male erectile dysfunction, unspecified: Secondary | ICD-10-CM

## 2012-06-17 DIAGNOSIS — N401 Enlarged prostate with lower urinary tract symptoms: Secondary | ICD-10-CM

## 2012-06-18 DIAGNOSIS — N138 Other obstructive and reflux uropathy: Secondary | ICD-10-CM | POA: Insufficient documentation

## 2012-06-18 MED ORDER — TADALAFIL 2.5 MG PO TABS: 2.5000 mg | ORAL_TABLET | Freq: Every day | ORAL | Status: DC

## 2012-06-18 NOTE — Telephone Encounter (Signed)
Request per patient

## 2013-01-12 ENCOUNTER — Other Ambulatory Visit: Payer: Self-pay | Admitting: *Deleted

## 2013-01-12 DIAGNOSIS — N529 Male erectile dysfunction, unspecified: Secondary | ICD-10-CM

## 2013-01-12 DIAGNOSIS — N138 Other obstructive and reflux uropathy: Secondary | ICD-10-CM

## 2013-01-12 MED ORDER — TADALAFIL 2.5 MG PO TABS: 2.5000 mg | ORAL_TABLET | Freq: Every day | ORAL | Status: DC

## 2013-06-07 ENCOUNTER — Telehealth: Payer: Self-pay | Admitting: Sports Medicine

## 2013-06-07 DIAGNOSIS — N400 Enlarged prostate without lower urinary tract symptoms: Secondary | ICD-10-CM

## 2013-06-07 DIAGNOSIS — Z13220 Encounter for screening for lipoid disorders: Secondary | ICD-10-CM

## 2013-06-07 NOTE — Telephone Encounter (Signed)
Orders placed.

## 2013-06-07 NOTE — Telephone Encounter (Signed)
Please see note below. Roxana Lai,CMA  

## 2013-06-07 NOTE — Telephone Encounter (Signed)
Pt called. He is a former patient of Thurmond ButtsWade and  wants lab order prior to his visit on 5/28/last seen by Thurmond ButtsWade on 11/14. Thank you.

## 2013-07-01 ENCOUNTER — Encounter: Payer: Self-pay | Admitting: Sports Medicine

## 2013-07-01 ENCOUNTER — Ambulatory Visit (INDEPENDENT_AMBULATORY_CARE_PROVIDER_SITE_OTHER): Payer: BC Managed Care – PPO | Admitting: Sports Medicine

## 2013-07-01 VITALS — BP 138/93 | HR 69 | Ht 72.0 in | Wt 212.0 lb

## 2013-07-01 DIAGNOSIS — N401 Enlarged prostate with lower urinary tract symptoms: Secondary | ICD-10-CM

## 2013-07-01 DIAGNOSIS — Z299 Encounter for prophylactic measures, unspecified: Secondary | ICD-10-CM

## 2013-07-01 DIAGNOSIS — Z1211 Encounter for screening for malignant neoplasm of colon: Secondary | ICD-10-CM

## 2013-07-01 DIAGNOSIS — N138 Other obstructive and reflux uropathy: Secondary | ICD-10-CM

## 2013-07-01 DIAGNOSIS — G4700 Insomnia, unspecified: Secondary | ICD-10-CM | POA: Insufficient documentation

## 2013-07-01 DIAGNOSIS — Z0000 Encounter for general adult medical examination without abnormal findings: Secondary | ICD-10-CM | POA: Insufficient documentation

## 2013-07-01 LAB — HEMOCCULT GUIAC POC 1CARD (OFFICE): Fecal Occult Blood, POC: NEGATIVE

## 2013-07-01 MED ORDER — ZOLPIDEM TARTRATE 10 MG PO TABS: 10.0000 mg | ORAL_TABLET | Freq: Every evening | ORAL | Status: DC | PRN

## 2013-07-01 MED ORDER — TADALAFIL 2.5 MG PO TABS: 5.0000 mg | ORAL_TABLET | Freq: Every day | ORAL | Status: DC

## 2013-07-01 NOTE — Progress Notes (Signed)
  Subjective:    CC: Complete physical exam  HPI:  This pleasant 53 year old male comes here for complete physical, colonoscopy was 3 years ago, he is happy without complaints.  BPH : with occasional nocturia well-controlled with Cialis.  Insomnia: Works in Education officer, environmental, lives in [place] but travels back here often, has tried melatonin appropriately without an improvement in sleep habits, desires that we try Ambien.  Past medical history, Surgical history, Family history not pertinant except as noted below, Social history, Allergies, and medications have been entered into the medical record, reviewed, and no changes needed.   Review of Systems: No headache, visual changes, nausea, vomiting, diarrhea, constipation, dizziness, abdominal pain, skin rash, fevers, chills, night sweats, swollen lymph nodes, weight loss, chest pain, body aches, joint swelling, muscle aches, shortness of breath, mood changes, visual or auditory hallucinations.  Objective:    General: Well Developed, well nourished, and in no acute distress.  Neuro: Alert and oriented x3, extra-ocular muscles intact, sensation grossly intact.  HEENT: Normocephalic, atraumatic, pupils equal round reactive to light, neck supple, no masses, no lymphadenopathy, thyroid nonpalpable. Oropharynx, nasopharynx, external ear canals are unremarkable. Skin: Warm and dry, no rashes noted.  Cardiac: Regular rate and rhythm, no murmurs rubs or gallops.  Respiratory: Clear to auscultation bilaterally. Not using accessory muscles, speaking in full sentences.  Abdominal: Soft, nontender, nondistended, positive bowel sounds, no masses, no organomegaly.  Musculoskeletal: Shoulder, elbow, wrist, hip, knee, ankle stable, and with full range of motion.  Impression and Recommendations:    The patient was counselled, risk factors were discussed, anticipatory guidance given.

## 2013-07-01 NOTE — Assessment & Plan Note (Signed)
Samples of Cialis given. Return as needed.

## 2013-07-01 NOTE — Assessment & Plan Note (Signed)
Travels to [place] quite often for work. Try melatonin without improvement. Trying Ambien.

## 2013-07-01 NOTE — Assessment & Plan Note (Signed)
Complete physical performed today. Routine blood work. Negative Hemoccult. Colonoscopy was 3 years ago.

## 2013-07-02 LAB — CBC
HCT: 42.7 % (ref 39.0–52.0)
Hemoglobin: 14.7 g/dL (ref 13.0–17.0)
MCH: 30.1 pg (ref 26.0–34.0)
MCHC: 34.4 g/dL (ref 30.0–36.0)
MCV: 87.5 fL (ref 78.0–100.0)
Platelets: 253 10*3/uL (ref 150–400)
RBC: 4.88 MIL/uL (ref 4.22–5.81)
RDW: 13.7 % (ref 11.5–15.5)
WBC: 3.6 10*3/uL — ABNORMAL LOW (ref 4.0–10.5)

## 2013-07-02 LAB — COMPREHENSIVE METABOLIC PANEL
ALT: 15 U/L (ref 0–53)
Alkaline Phosphatase: 59 U/L (ref 39–117)
Calcium: 9.6 mg/dL (ref 8.4–10.5)
Chloride: 102 mEq/L (ref 96–112)
Creat: 1.04 mg/dL (ref 0.50–1.35)
Sodium: 139 mEq/L (ref 135–145)
Total Bilirubin: 0.7 mg/dL (ref 0.2–1.2)
Total Protein: 7.6 g/dL (ref 6.0–8.3)

## 2013-07-02 LAB — LIPID PANEL
Cholesterol: 213 mg/dL — ABNORMAL HIGH (ref 0–200)
HDL: 60 mg/dL (ref >39)
LDL Cholesterol: 138 mg/dL — ABNORMAL HIGH (ref 0–99)
Total CHOL/HDL Ratio: 3.6 Ratio
Triglycerides: 75 mg/dL (ref <150)
VLDL: 15 mg/dL (ref 0–40)

## 2013-07-02 LAB — HEMOGLOBIN A1C
Hgb A1c MFr Bld: 5.4 % (ref <5.7)
Mean Plasma Glucose: 108 mg/dL (ref <117)

## 2013-07-02 LAB — COMPREHENSIVE METABOLIC PANEL WITH GFR
AST: 23 U/L (ref 0–37)
Albumin: 4.8 g/dL (ref 3.5–5.2)
BUN: 10 mg/dL (ref 6–23)
CO2: 30 meq/L (ref 19–32)
Glucose, Bld: 95 mg/dL (ref 70–99)
Potassium: 4.1 meq/L (ref 3.5–5.3)

## 2013-07-02 LAB — PSA, TOTAL AND FREE
PSA, Free Pct: 34 % (ref >25)
PSA, Free: 0.13 ng/mL
PSA: 0.38 ng/mL (ref <=4.00)

## 2013-07-02 LAB — TSH: TSH: 1.339 u[IU]/mL (ref 0.350–4.500)

## 2013-10-25 ENCOUNTER — Encounter: Payer: Self-pay | Admitting: Podiatry

## 2013-10-25 ENCOUNTER — Ambulatory Visit (INDEPENDENT_AMBULATORY_CARE_PROVIDER_SITE_OTHER): Payer: Managed Care, Other (non HMO) | Admitting: Podiatry

## 2013-10-25 VITALS — BP 134/68 | HR 70 | Ht 72.0 in | Wt 210.0 lb

## 2013-10-25 DIAGNOSIS — B351 Tinea unguium: Secondary | ICD-10-CM

## 2013-10-25 DIAGNOSIS — B353 Tinea pedis: Secondary | ICD-10-CM

## 2013-10-25 MED ORDER — TAVABOROLE 5 % EX SOLN: 1.0000 "application " | Freq: Every morning | CUTANEOUS | Status: DC

## 2013-10-25 NOTE — Patient Instructions (Signed)
Seen for dry skin and fungal nail. Kerydin prescribed for nails. Use Dandruff shampoo to scrub off dry skin daily. Use cotton ball in between 4th and 5th digit to aerate and prevent skin lesion. Return in 3 months.

## 2013-10-25 NOTE — Progress Notes (Signed)
Subjective: 53 year old male presents accompanied by wife complaining of dry scaly skin, white skin lesion in between 4th and 5th digit both feet, and with abnormal toe nails. Between the toes are itch at times. 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   Objective: Dermatologic:Dry scaly skin plantar surface balls and heels bilateral. White discolorated itch skin between 4th and 5th digits without open skin bilateral. White specks and streaks on all toe nails bilateral. Neurologic: All epicritic and tactile sensations grossly intact. Normal response to vibratory and monofilament sensory testing. Ortho: Rectus foot without gross deformity bilateral. Normal range of motion rearfoot and forefoot. Good ankle dorsiflexion with normal range.  Assessment: Tinea pedis plantar surface bilateral. Onychomycosis x 10.  Plan: Reviewed findings and available options.  Patient is to wash and scrub with head and shoulder dandruff shampoo daily. Kerydin antifungal medication prescribed.

## 2014-07-20 ENCOUNTER — Telehealth: Payer: Self-pay | Admitting: *Deleted

## 2014-07-20 MED ORDER — CICLOPIROX 8 % EX SOLN: Freq: Every day | CUTANEOUS | Status: DC

## 2014-07-20 NOTE — Telephone Encounter (Signed)
07/20/14 Dr. Raynald Kemp, Pts wife called and said you gave patient Ernest Nguyen, [employer] cover it and they can substitute Ciclodan 8% topical solution, Is this ok and she uses [facility] on [address].

## 2014-10-14 ENCOUNTER — Ambulatory Visit (INDEPENDENT_AMBULATORY_CARE_PROVIDER_SITE_OTHER): Payer: BLUE CROSS/BLUE SHIELD | Admitting: Sports Medicine

## 2014-10-14 ENCOUNTER — Encounter: Payer: Self-pay | Admitting: Sports Medicine

## 2014-10-14 VITALS — BP 128/85 | HR 68 | Ht 72.0 in | Wt 215.0 lb

## 2014-10-14 DIAGNOSIS — N138 Other obstructive and reflux uropathy: Secondary | ICD-10-CM

## 2014-10-14 DIAGNOSIS — Z1211 Encounter for screening for malignant neoplasm of colon: Secondary | ICD-10-CM | POA: Diagnosis not present

## 2014-10-14 DIAGNOSIS — E785 Hyperlipidemia, unspecified: Secondary | ICD-10-CM | POA: Diagnosis not present

## 2014-10-14 DIAGNOSIS — N401 Enlarged prostate with lower urinary tract symptoms: Secondary | ICD-10-CM

## 2014-10-14 DIAGNOSIS — Z0000 Encounter for general adult medical examination without abnormal findings: Secondary | ICD-10-CM | POA: Diagnosis not present

## 2014-10-14 LAB — CBC
HCT: 43.6 % (ref 39.0–52.0)
Hemoglobin: 15 g/dL (ref 13.0–17.0)
MCH: 30.8 pg (ref 26.0–34.0)
MCHC: 34.4 g/dL (ref 30.0–36.0)
MCV: 89.5 fL (ref 78.0–100.0)
MPV: 9 fL (ref 8.6–12.4)
Platelets: 239 K/uL (ref 150–400)
RBC: 4.87 MIL/uL (ref 4.22–5.81)
RDW: 13.2 % (ref 11.5–15.5)
WBC: 3.3 K/uL — ABNORMAL LOW (ref 4.0–10.5)

## 2014-10-14 LAB — HEMOCCULT GUIAC POC 1CARD (OFFICE): Fecal Occult Blood, POC: NEGATIVE

## 2014-10-14 MED ORDER — TADALAFIL 5 MG PO TABS: 5.0000 mg | ORAL_TABLET | Freq: Every day | ORAL | Status: DC

## 2014-10-14 NOTE — Progress Notes (Signed)
  Subjective:    CC: annual physical exam   HPI:  This pleasant 54 year old male returns, he has now moved back to the [place] for good, and is no longer traveling back and forth to [place]. He is happy, doing well, and has no complaints.  Past medical history, Surgical history, Family history not pertinant except as noted below, Social history, Allergies, and medications have been entered into the medical record, reviewed, and no changes needed.   Review of Systems: No headache, visual changes, nausea, vomiting, diarrhea, constipation, dizziness, abdominal pain, skin rash, fevers, chills, night sweats, swollen lymph nodes, weight loss, chest pain, body aches, joint swelling, muscle aches, shortness of breath, mood changes, visual or auditory hallucinations.  Objective:    General: Well Developed, well nourished, and in no acute distress.  Neuro: Alert and oriented x3, extra-ocular muscles intact, sensation grossly intact. Cranial nerves II through XII are intact, motor, sensory, and coordinative functions are all intact. HEENT: Normocephalic, atraumatic, pupils equal round reactive to light, neck supple, no masses, no lymphadenopathy, thyroid nonpalpable. Oropharynx, nasopharynx, external ear canals are unremarkable. Skin: Warm and dry, no rashes noted.  Cardiac: Regular rate and rhythm, no murmurs rubs or gallops.  Respiratory: Clear to auscultation bilaterally. Not using accessory muscles, speaking in full sentences.  Abdominal: Soft, nontender, nondistended, positive bowel sounds, no masses, no organomegaly.  Musculoskeletal: Shoulder, elbow, wrist, hip, knee, ankle stable, and with full range of motion. Rectal: Smooth prostate, good tone, heme occult negative  Impression and Recommendations:    The patient was counselled, risk factors were discussed, anticipatory guidance given.

## 2014-10-14 NOTE — Patient Instructions (Signed)
May use OTC red rice yeast extract if lipids elevated

## 2014-10-14 NOTE — Assessment & Plan Note (Signed)
Physical as above with a negative heme occult.

## 2014-10-14 NOTE — Assessment & Plan Note (Signed)
Continue Cialis.

## 2014-10-15 LAB — COMPREHENSIVE METABOLIC PANEL
BUN: 11 mg/dL (ref 7–25)
CO2: 28 mmol/L (ref 20–31)
Chloride: 99 mmol/L (ref 98–110)
Creat: 1.14 mg/dL (ref 0.70–1.33)
Glucose, Bld: 91 mg/dL (ref 65–99)
Sodium: 138 mmol/L (ref 135–146)
Total Bilirubin: 0.8 mg/dL (ref 0.2–1.2)

## 2014-10-15 LAB — COMPREHENSIVE METABOLIC PANEL WITH GFR
ALT: 17 U/L (ref 9–46)
AST: 25 U/L (ref 10–35)
Albumin: 4.6 g/dL (ref 3.6–5.1)
Alkaline Phosphatase: 61 U/L (ref 40–115)
Calcium: 9.8 mg/dL (ref 8.6–10.3)
Potassium: 4.2 mmol/L (ref 3.5–5.3)
Total Protein: 7.4 g/dL (ref 6.1–8.1)

## 2014-10-15 LAB — LIPID PANEL
Cholesterol: 226 mg/dL — ABNORMAL HIGH (ref 125–200)
HDL: 63 mg/dL (ref >=40)
LDL Cholesterol: 149 mg/dL — ABNORMAL HIGH (ref <130)
Total CHOL/HDL Ratio: 3.6 ratio (ref <=5.0)
Triglycerides: 68 mg/dL (ref <150)
VLDL: 14 mg/dL (ref <30)

## 2014-10-15 LAB — VITAMIN D 25 HYDROXY (VIT D DEFICIENCY, FRACTURES): Vit D, 25-Hydroxy: 17 ng/mL — ABNORMAL LOW (ref 30–100)

## 2014-10-15 LAB — PSA, TOTAL AND FREE
PSA, Free Pct: 41 % (ref >25)
PSA, Free: 0.15 ng/mL
PSA: 0.37 ng/mL (ref <=4.00)

## 2014-10-17 ENCOUNTER — Telehealth: Payer: Self-pay | Admitting: Sports Medicine

## 2014-10-17 ENCOUNTER — Encounter: Payer: Managed Care, Other (non HMO) | Admitting: Sports Medicine

## 2014-10-17 DIAGNOSIS — E785 Hyperlipidemia, unspecified: Secondary | ICD-10-CM | POA: Insufficient documentation

## 2014-10-17 MED ORDER — VITAMIN D (ERGOCALCIFEROL) 1.25 MG (50000 UNIT) PO CAPS: 50000.0000 [IU] | ORAL_CAPSULE | ORAL | Status: DC

## 2014-10-17 NOTE — Telephone Encounter (Signed)
Received fax for prior authorization on Cialis mg sent through cover my meds waiting on authorization. - CF

## 2014-10-17 NOTE — Addendum Note (Signed)
Addended by: Monica Becton on: 10/17/2014 11:59 AM   Modules accepted: Orders

## 2014-10-19 ENCOUNTER — Telehealth: Payer: Self-pay

## 2014-10-19 NOTE — Telephone Encounter (Signed)
Patient spouse called to check on PA for Cilias advised patient that were we waiting on approval from insurance company. Rhonda Cunningham,CMA

## 2014-10-27 NOTE — Telephone Encounter (Signed)
Received authorization on Cialis. Pharmacy has been notified. Patient notified of approval.

## 2014-10-27 NOTE — Telephone Encounter (Signed)
Cialis Auth #: [id number]. Valid: 09/17/14-10/17/15. Pharmacy notified.

## 2014-12-30 ENCOUNTER — Encounter: Payer: Self-pay | Admitting: Sports Medicine

## 2015-05-30 ENCOUNTER — Other Ambulatory Visit: Payer: Self-pay

## 2015-05-30 DIAGNOSIS — G4700 Insomnia, unspecified: Secondary | ICD-10-CM

## 2015-05-30 DIAGNOSIS — N138 Other obstructive and reflux uropathy: Secondary | ICD-10-CM

## 2015-05-30 DIAGNOSIS — N401 Enlarged prostate with lower urinary tract symptoms: Principal | ICD-10-CM

## 2015-05-30 MED ORDER — TADALAFIL 5 MG PO TABS: 5.0000 mg | ORAL_TABLET | Freq: Every day | ORAL | Status: DC

## 2015-05-30 MED ORDER — ZOLPIDEM TARTRATE 10 MG PO TABS: 10.0000 mg | ORAL_TABLET | Freq: Every evening | ORAL | Status: DC | PRN

## 2015-05-30 MED FILL — ZOLPIDEM TARTRATE 10 MG TAB: 10 | 30 days supply | Qty: 30 | Fill #0

## 2015-09-19 ENCOUNTER — Telehealth: Payer: Self-pay | Admitting: Sports Medicine

## 2015-09-19 DIAGNOSIS — Z0000 Encounter for general adult medical examination without abnormal findings: Secondary | ICD-10-CM

## 2015-09-19 NOTE — Telephone Encounter (Signed)
All orders placed

## 2015-09-19 NOTE — Telephone Encounter (Signed)
Pt has CPE  appointment on 8/21 and would like to pick up Order on  Thursday 8/17.  Thank you.

## 2015-09-22 ENCOUNTER — Other Ambulatory Visit: Payer: Self-pay | Admitting: Sports Medicine

## 2015-09-22 LAB — CBC
HCT: 41.6 % (ref 38.5–50.0)
Hemoglobin: 14 g/dL (ref 13.2–17.1)
MCH: 30.3 pg (ref 27.0–33.0)
MCHC: 33.7 g/dL (ref 32.0–36.0)
MCV: 90 fL (ref 80.0–100.0)
MPV: 9.1 fL (ref 7.5–12.5)
Platelets: 236 10*3/uL (ref 140–400)
RBC: 4.62 MIL/uL (ref 4.20–5.80)
RDW: 13.8 % (ref 11.0–15.0)
WBC: 3.1 10*3/uL — ABNORMAL LOW (ref 3.8–10.8)

## 2015-09-23 LAB — COMPREHENSIVE METABOLIC PANEL
ALT: 17 U/L (ref 9–46)
AST: 22 U/L (ref 10–35)
BUN: 10 mg/dL (ref 7–25)
Creat: 1.11 mg/dL (ref 0.70–1.33)
Glucose, Bld: 90 mg/dL (ref 65–99)
Potassium: 4.3 mmol/L (ref 3.5–5.3)
Total Protein: 6.7 g/dL (ref 6.1–8.1)

## 2015-09-23 LAB — HEPATITIS C ANTIBODY: HCV Ab: NEGATIVE

## 2015-09-23 LAB — COMPREHENSIVE METABOLIC PANEL WITH GFR
Albumin: 4.3 g/dL (ref 3.6–5.1)
Alkaline Phosphatase: 45 U/L (ref 40–115)
CO2: 21 mmol/L (ref 20–31)
Calcium: 9.2 mg/dL (ref 8.6–10.3)
Chloride: 101 mmol/L (ref 98–110)
Sodium: 139 mmol/L (ref 135–146)
Total Bilirubin: 0.8 mg/dL (ref 0.2–1.2)

## 2015-09-23 LAB — HEMOGLOBIN A1C
Hgb A1c MFr Bld: 5.2 % (ref <5.7)
Mean Plasma Glucose: 103 mg/dL

## 2015-09-23 LAB — LIPID PANEL
Cholesterol: 203 mg/dL — ABNORMAL HIGH (ref 125–200)
HDL: 57 mg/dL (ref >=40)
LDL Cholesterol: 127 mg/dL (ref <130)
Total CHOL/HDL Ratio: 3.6 ratio (ref <=5.0)
Triglycerides: 94 mg/dL (ref <150)
VLDL: 19 mg/dL (ref <30)

## 2015-09-23 LAB — HIV ANTIBODY (ROUTINE TESTING W REFLEX): HIV 1&2 Ab, 4th Generation: NONREACTIVE

## 2015-09-25 ENCOUNTER — Encounter: Payer: BLUE CROSS/BLUE SHIELD | Admitting: Sports Medicine

## 2015-09-25 LAB — PSA, TOTAL AND FREE
PSA, % Free: 25 % — ABNORMAL LOW (ref >25)
PSA, Free: 0.1 ng/mL
PSA, Total: 0.4 ng/mL (ref <=4.0)

## 2015-09-28 ENCOUNTER — Encounter: Payer: Self-pay | Admitting: Sports Medicine

## 2015-09-28 ENCOUNTER — Ambulatory Visit (INDEPENDENT_AMBULATORY_CARE_PROVIDER_SITE_OTHER): Payer: BLUE CROSS/BLUE SHIELD | Admitting: Sports Medicine

## 2015-09-28 DIAGNOSIS — N401 Enlarged prostate with lower urinary tract symptoms: Secondary | ICD-10-CM

## 2015-09-28 DIAGNOSIS — G4700 Insomnia, unspecified: Secondary | ICD-10-CM | POA: Diagnosis not present

## 2015-09-28 DIAGNOSIS — Z0000 Encounter for general adult medical examination without abnormal findings: Secondary | ICD-10-CM

## 2015-09-28 DIAGNOSIS — N138 Other obstructive and reflux uropathy: Secondary | ICD-10-CM

## 2015-09-28 MED ORDER — TADALAFIL 5 MG PO TABS: 5.0000 mg | ORAL_TABLET | Freq: Every day | ORAL | 3 refills | Status: DC

## 2015-09-28 MED ORDER — ZOLPIDEM TARTRATE 10 MG PO TABS: 10.0000 mg | ORAL_TABLET | Freq: Every evening | ORAL | 3 refills | Status: DC | PRN

## 2015-09-28 NOTE — Assessment & Plan Note (Signed)
Refilling Cialis

## 2015-09-28 NOTE — Assessment & Plan Note (Signed)
Refilling Ambien. ?

## 2015-09-28 NOTE — Progress Notes (Signed)
  Subjective:    CC: Annual physical exam   HPI:  Healthy male, no complaints.  Past medical history, Surgical history, Family history not pertinant except as noted below, Social history, Allergies, and medications have been entered into the medical record, reviewed, and no changes needed.   Review of Systems: No headache, visual changes, nausea, vomiting, diarrhea, constipation, dizziness, abdominal pain, skin rash, fevers, chills, night sweats, swollen lymph nodes, weight loss, chest pain, body aches, joint swelling, muscle aches, shortness of breath, mood changes, visual or auditory hallucinations.  Objective:    General: Well Developed, well nourished, and in no acute distress.  Neuro: Alert and oriented x3, extra-ocular muscles intact, sensation grossly intact. Cranial nerves II through XII are intact, motor, sensory, and coordinative functions are all intact. HEENT: Normocephalic, atraumatic, pupils equal round reactive to light, neck supple, no masses, no lymphadenopathy, thyroid nonpalpable. Oropharynx, nasopharynx, external ear canals are unremarkable. Skin: Warm and dry, no rashes noted.  Cardiac: Regular rate and rhythm, no murmurs rubs or gallops.  Respiratory: Clear to auscultation bilaterally. Not using accessory muscles, speaking in full sentences.  Abdominal: Soft, nontender, nondistended, positive bowel sounds, no masses, no organomegaly.  Musculoskeletal: Shoulder, elbow, wrist, hip, knee, ankle stable, and with full range of motion.  Impression and Recommendations:    The patient was counselled, risk factors were discussed, anticipatory guidance given.  Annual physical exam Unremarkable annual physical.  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

## 2015-09-28 NOTE — Assessment & Plan Note (Signed)
Unremarkable annual physical.

## 2016-02-22 ENCOUNTER — Other Ambulatory Visit: Payer: Self-pay | Admitting: Sports Medicine

## 2016-02-22 DIAGNOSIS — N138 Other obstructive and reflux uropathy: Secondary | ICD-10-CM

## 2016-02-22 DIAGNOSIS — N401 Enlarged prostate with lower urinary tract symptoms: Principal | ICD-10-CM

## 2016-02-26 ENCOUNTER — Telehealth: Payer: Self-pay | Admitting: *Deleted

## 2016-02-26 NOTE — Telephone Encounter (Signed)
Cialis 5 mg submitted and approved Approvedtoday  [id number]:[id number];OZHYQMVCaseId:[id number];Product Name:ST: Cialis (tadalafil) PA - ESI;Status:Approved;Coverage Start Date:01/27/2016;Coverage End Date:02/25/2017;  pharm notified

## 2016-02-29 ENCOUNTER — Other Ambulatory Visit: Payer: Self-pay

## 2016-02-29 DIAGNOSIS — N401 Enlarged prostate with lower urinary tract symptoms: Principal | ICD-10-CM

## 2016-02-29 DIAGNOSIS — N138 Other obstructive and reflux uropathy: Secondary | ICD-10-CM

## 2016-02-29 MED ORDER — TADALAFIL 5 MG PO TABS: 5.0000 mg | ORAL_TABLET | Freq: Every day | ORAL | 3 refills | Status: DC

## 2016-03-05 MED FILL — CIALIS 5 MG TABLET: 5 | 30 days supply | Qty: 30 | Fill #0

## 2016-04-18 MED FILL — CIALIS 5 MG TABLET: 5 | 30 days supply | Qty: 30 | Fill #1

## 2016-04-19 ENCOUNTER — Other Ambulatory Visit: Payer: Self-pay

## 2016-04-19 DIAGNOSIS — G4700 Insomnia, unspecified: Secondary | ICD-10-CM

## 2016-04-19 MED ORDER — ZOLPIDEM TARTRATE 10 MG PO TABS: 10.0000 mg | ORAL_TABLET | Freq: Every evening | ORAL | 3 refills | Status: DC | PRN

## 2016-04-19 MED FILL — ZOLPIDEM TARTRATE 10 MG TAB: 10 | 30 days supply | Qty: 30 | Fill #0

## 2016-05-29 MED FILL — CIALIS 5 MG TABLET: 5 | 30 days supply | Qty: 30 | Fill #2

## 2016-10-28 MED FILL — CIALIS 5 MG TABLET: 5 | 30 days supply | Qty: 30 | Fill #3

## 2016-11-25 ENCOUNTER — Telehealth: Payer: Self-pay | Admitting: Sports Medicine

## 2016-11-25 DIAGNOSIS — Z0000 Encounter for general adult medical examination without abnormal findings: Secondary | ICD-10-CM

## 2016-11-25 NOTE — Telephone Encounter (Signed)
Labs ordered.

## 2016-11-25 NOTE — Telephone Encounter (Signed)
Patient called scheduled a physical for Nov 1st and would like to get lab order sent down to get labs for 12/02/16. Thanks

## 2016-12-04 LAB — CBC
HCT: 41.8 % (ref 38.5–50.0)
Hemoglobin: 14.4 g/dL (ref 13.2–17.1)
MCH: 30.6 pg (ref 27.0–33.0)
MCHC: 34.4 g/dL (ref 32.0–36.0)
MCV: 88.7 fL (ref 80.0–100.0)
MPV: 9.2 fL (ref 7.5–12.5)
Platelets: 238 10*3/uL (ref 140–400)
RBC: 4.71 10*6/uL (ref 4.20–5.80)
RDW: 12.4 % (ref 11.0–15.0)
WBC: 3.1 Thousand/uL — ABNORMAL LOW (ref 3.8–10.8)

## 2016-12-04 LAB — VITAMIN D 25 HYDROXY (VIT D DEFICIENCY, FRACTURES): Vit D, 25-Hydroxy: 17 ng/mL — ABNORMAL LOW (ref 30–100)

## 2016-12-04 LAB — LIPID PANEL W/REFLEX DIRECT LDL
Cholesterol: 191 mg/dL (ref <200)
HDL: 58 mg/dL (ref >40)
LDL Cholesterol (Calc): 114 mg/dL — ABNORMAL HIGH
Non-HDL Cholesterol (Calc): 133 mg/dL (calc) — ABNORMAL HIGH (ref <130)
Total CHOL/HDL Ratio: 3.3 (calc) (ref <5.0)
Triglycerides: 86 mg/dL (ref <150)

## 2016-12-04 LAB — COMPREHENSIVE METABOLIC PANEL
AG Ratio: 1.7 (calc) (ref 1.0–2.5)
Albumin: 4.4 g/dL (ref 3.6–5.1)
Alkaline phosphatase (APISO): 51 U/L (ref 40–115)
BUN: 11 mg/dL (ref 7–25)
CO2: 29 mmol/L (ref 20–32)
Calcium: 9.6 mg/dL (ref 8.6–10.3)
Chloride: 103 mmol/L (ref 98–110)
Globulin: 2.6 g/dL (calc) (ref 1.9–3.7)
Glucose, Bld: 95 mg/dL (ref 65–99)
Potassium: 4.3 mmol/L (ref 3.5–5.3)
Sodium: 139 mmol/L (ref 135–146)
Total Bilirubin: 0.8 mg/dL (ref 0.2–1.2)

## 2016-12-04 LAB — COMPREHENSIVE METABOLIC PANEL WITH GFR
ALT: 15 U/L (ref 9–46)
AST: 23 U/L (ref 10–35)
Creat: 1.09 mg/dL (ref 0.70–1.33)
Total Protein: 7 g/dL (ref 6.1–8.1)

## 2016-12-04 LAB — HEMOGLOBIN A1C
Hgb A1c MFr Bld: 5.3 %{Hb} (ref <5.7)
Mean Plasma Glucose: 105 (calc)
eAG (mmol/L): 5.8 (calc)

## 2016-12-04 LAB — TSH: TSH: 1.05 mIU/L (ref 0.40–4.50)

## 2016-12-04 MED ORDER — VITAMIN D (ERGOCALCIFEROL) 1.25 MG (50000 UNIT) PO CAPS: 50000.0000 [IU] | ORAL_CAPSULE | ORAL | 0 refills | Status: DC

## 2016-12-04 NOTE — Addendum Note (Signed)
Addended by: Monica BectonHEKKEKANDAM, Jaselynn Tamas J on: 12/04/2016 01:27 PM   Modules accepted: Orders

## 2016-12-05 MED FILL — CIALIS 5 MG TABLET: 5 | 30 days supply | Qty: 30 | Fill #4

## 2016-12-06 ENCOUNTER — Ambulatory Visit (INDEPENDENT_AMBULATORY_CARE_PROVIDER_SITE_OTHER): Payer: BLUE CROSS/BLUE SHIELD | Admitting: Sports Medicine

## 2016-12-06 DIAGNOSIS — E785 Hyperlipidemia, unspecified: Secondary | ICD-10-CM | POA: Diagnosis not present

## 2016-12-06 DIAGNOSIS — N401 Enlarged prostate with lower urinary tract symptoms: Secondary | ICD-10-CM

## 2016-12-06 DIAGNOSIS — Z0000 Encounter for general adult medical examination without abnormal findings: Secondary | ICD-10-CM

## 2016-12-06 DIAGNOSIS — G4700 Insomnia, unspecified: Secondary | ICD-10-CM

## 2016-12-06 DIAGNOSIS — N138 Other obstructive and reflux uropathy: Secondary | ICD-10-CM | POA: Diagnosis not present

## 2016-12-06 DIAGNOSIS — Z23 Encounter for immunization: Secondary | ICD-10-CM

## 2016-12-06 MED ORDER — ZOLPIDEM TARTRATE 10 MG PO TABS: 10.0000 mg | ORAL_TABLET | Freq: Every evening | ORAL | 3 refills | Status: DC | PRN

## 2016-12-06 MED ORDER — TADALAFIL 5 MG PO TABS: 5.0000 mg | ORAL_TABLET | Freq: Every day | ORAL | 3 refills | Status: DC

## 2016-12-06 MED ORDER — VITAMIN D (ERGOCALCIFEROL) 1.25 MG (50000 UNIT) PO CAPS: 50000.0000 [IU] | ORAL_CAPSULE | ORAL | 0 refills | Status: DC

## 2016-12-06 MED FILL — ZOLPIDEM TARTRATE 10 MG TAB: 10 | 30 days supply | Qty: 30 | Fill #0

## 2016-12-06 NOTE — Assessment & Plan Note (Signed)
Stable, no changes needed. 

## 2016-12-06 NOTE — Assessment & Plan Note (Signed)
Unremarkable routine physical, flu shot today. Rectal exam deferred due to patient request.

## 2016-12-06 NOTE — Addendum Note (Signed)
Addended by: Baird KayUGLAS, Shermika Balthaser M on: 12/06/2016 02:18 PM   Modules accepted: Orders

## 2016-12-06 NOTE — Progress Notes (Signed)
  Subjective:    CC: Annual physical  HPI:  This is a pleasant 56 year old male here for his physical, we obtained routine blood work, all of which was normal with the exception of a low but stable white blood cell count as well as low vitamin D which was repleted, he has no complaints.  Past medical history:  Negative.  See flowsheet/record as well for more information.  Surgical history: Negative.  See flowsheet/record as well for more information.  Family history: Negative.  See flowsheet/record as well for more information.  Social history: Negative.  See flowsheet/record as well for more information.  Allergies, and medications have been entered into the medical record, reviewed, and no changes needed.    Review of Systems: No headache, visual changes, nausea, vomiting, diarrhea, constipation, dizziness, abdominal pain, skin rash, fevers, chills, night sweats, swollen lymph nodes, weight loss, chest pain, body aches, joint swelling, muscle aches, shortness of breath, mood changes, visual or auditory hallucinations.  Objective:    General: Well Developed, well nourished, and in no acute distress.  Neuro: Alert and oriented x3, extra-ocular muscles intact, sensation grossly intact. Cranial nerves II through XII are intact, motor, sensory, and coordinative functions are all intact. HEENT: Normocephalic, atraumatic, pupils equal round reactive to light, neck supple, no masses, no lymphadenopathy, thyroid nonpalpable. Oropharynx, nasopharynx, external ear canals are unremarkable. Skin: Warm and dry, no rashes noted.  Cardiac: Regular rate and rhythm, no murmurs rubs or gallops.  Respiratory: Clear to auscultation bilaterally. Not using accessory muscles, speaking in full sentences.  Abdominal: Soft, nontender, nondistended, positive bowel sounds, no masses, no organomegaly.  Musculoskeletal: Shoulder, elbow, wrist, hip, knee, ankle stable, and with full range of motion.  Impression and  Recommendations:    The patient was counselled, risk factors were discussed, anticipatory guidance given.  Annual physical exam Unremarkable routine physical, flu shot today. Rectal exam deferred due to patient request.  Hyperlipidemia Stable, no changes needed.  ___________________________________________ Ihor Austinhomas J. Benjamin Stainhekkekandam, M.D., ABFM., CAQSM. Primary Care and Sports Medicine [facility]  Adjunct Instructor of Family Medicine  [employer]

## 2017-01-07 MED FILL — VIT D2 1.25 MG (50,000 UNIT: 1.25 MG | 28 days supply | Qty: 4 | Fill #0

## 2017-01-07 MED FILL — CIALIS 5 MG TABLET: 5 | 30 days supply | Qty: 30 | Fill #5

## 2017-02-14 ENCOUNTER — Telehealth: Payer: Self-pay | Admitting: *Deleted

## 2017-02-14 NOTE — Telephone Encounter (Signed)
Pre Authorization sent to cover my meds. [id number]

## 2017-02-17 NOTE — Telephone Encounter (Signed)
Your request has been approved  CaseId:[id number];Status:Approved;Review Type:Prior Auth;Coverage Start Date:01/15/2017;Coverage End Date:02/14/2018;;CaseId:[id number];Status:Approved;Review Type:Qty;Coverage Start Date:01/15/2017;Coverage End Date:02/14/2018  Pharmacy notified

## 2017-03-06 MED FILL — VIT D2 1.25 MG (50,000 UNIT: 1.25 MG | 28 days supply | Qty: 4 | Fill #1

## 2017-05-30 DIAGNOSIS — J302 Other seasonal allergic rhinitis: Secondary | ICD-10-CM | POA: Diagnosis not present

## 2017-05-30 DIAGNOSIS — R51 Headache: Secondary | ICD-10-CM | POA: Diagnosis not present

## 2017-07-28 ENCOUNTER — Telehealth: Payer: Self-pay

## 2017-07-28 ENCOUNTER — Emergency Department (HOSPITAL_BASED_OUTPATIENT_CLINIC_OR_DEPARTMENT_OTHER)
Admission: EM | Admit: 2017-07-28 | Discharge: 2017-07-28 | Disposition: A | Discharge status: Home / Self Care | Payer: BLUE CROSS/BLUE SHIELD | Attending: Emergency Medicine | Admitting: Emergency Medicine

## 2017-07-28 ENCOUNTER — Encounter (HOSPITAL_BASED_OUTPATIENT_CLINIC_OR_DEPARTMENT_OTHER): Payer: Self-pay

## 2017-07-28 ENCOUNTER — Emergency Department (HOSPITAL_BASED_OUTPATIENT_CLINIC_OR_DEPARTMENT_OTHER): Payer: BLUE CROSS/BLUE SHIELD

## 2017-07-28 ENCOUNTER — Other Ambulatory Visit: Payer: Self-pay

## 2017-07-28 DIAGNOSIS — R072 Precordial pain: Secondary | ICD-10-CM | POA: Diagnosis not present

## 2017-07-28 DIAGNOSIS — R079 Chest pain, unspecified: Secondary | ICD-10-CM | POA: Diagnosis not present

## 2017-07-28 DIAGNOSIS — R0789 Other chest pain: Secondary | ICD-10-CM

## 2017-07-28 LAB — BASIC METABOLIC PANEL
Anion gap: 7 (ref 5–15)
BUN: 14 mg/dL (ref 6–20)
CHLORIDE: 102 mmol/L (ref 101–111)
CO2: 27 mmol/L (ref 22–32)
CREATININE: 1.17 mg/dL (ref 0.61–1.24)
Calcium: 9.4 mg/dL (ref 8.9–10.3)
GFR calc Af Amer: >60 mL/min (ref >60)
GLUCOSE: 97 mg/dL (ref 65–99)
POTASSIUM: 4 mmol/L (ref 3.5–5.1)
Sodium: 136 mmol/L (ref 135–145)

## 2017-07-28 LAB — CBC
HEMATOCRIT: 40.7 % (ref 39.0–52.0)
Hemoglobin: 14.3 g/dL (ref 13.0–17.0)
MCH: 31.1 pg (ref 26.0–34.0)
MCHC: 35.1 g/dL (ref 30.0–36.0)
MCV: 88.5 fL (ref 78.0–100.0)
Platelets: 216 10*3/uL (ref 150–400)
RBC: 4.6 MIL/uL (ref 4.22–5.81)
RDW: 12.2 % (ref 11.5–15.5)
WBC: 4 10*3/uL (ref 4.0–10.5)

## 2017-07-28 LAB — TROPONIN I

## 2017-07-28 IMAGING — CR DG CHEST 2V
2 series · 2 of 2 positions shown · non-contrast
Comparison: None.

CLINICAL DATA: Left-sided chest pain for 4 days.

EXAM:
CHEST - 2 VIEW

[w chest pa]
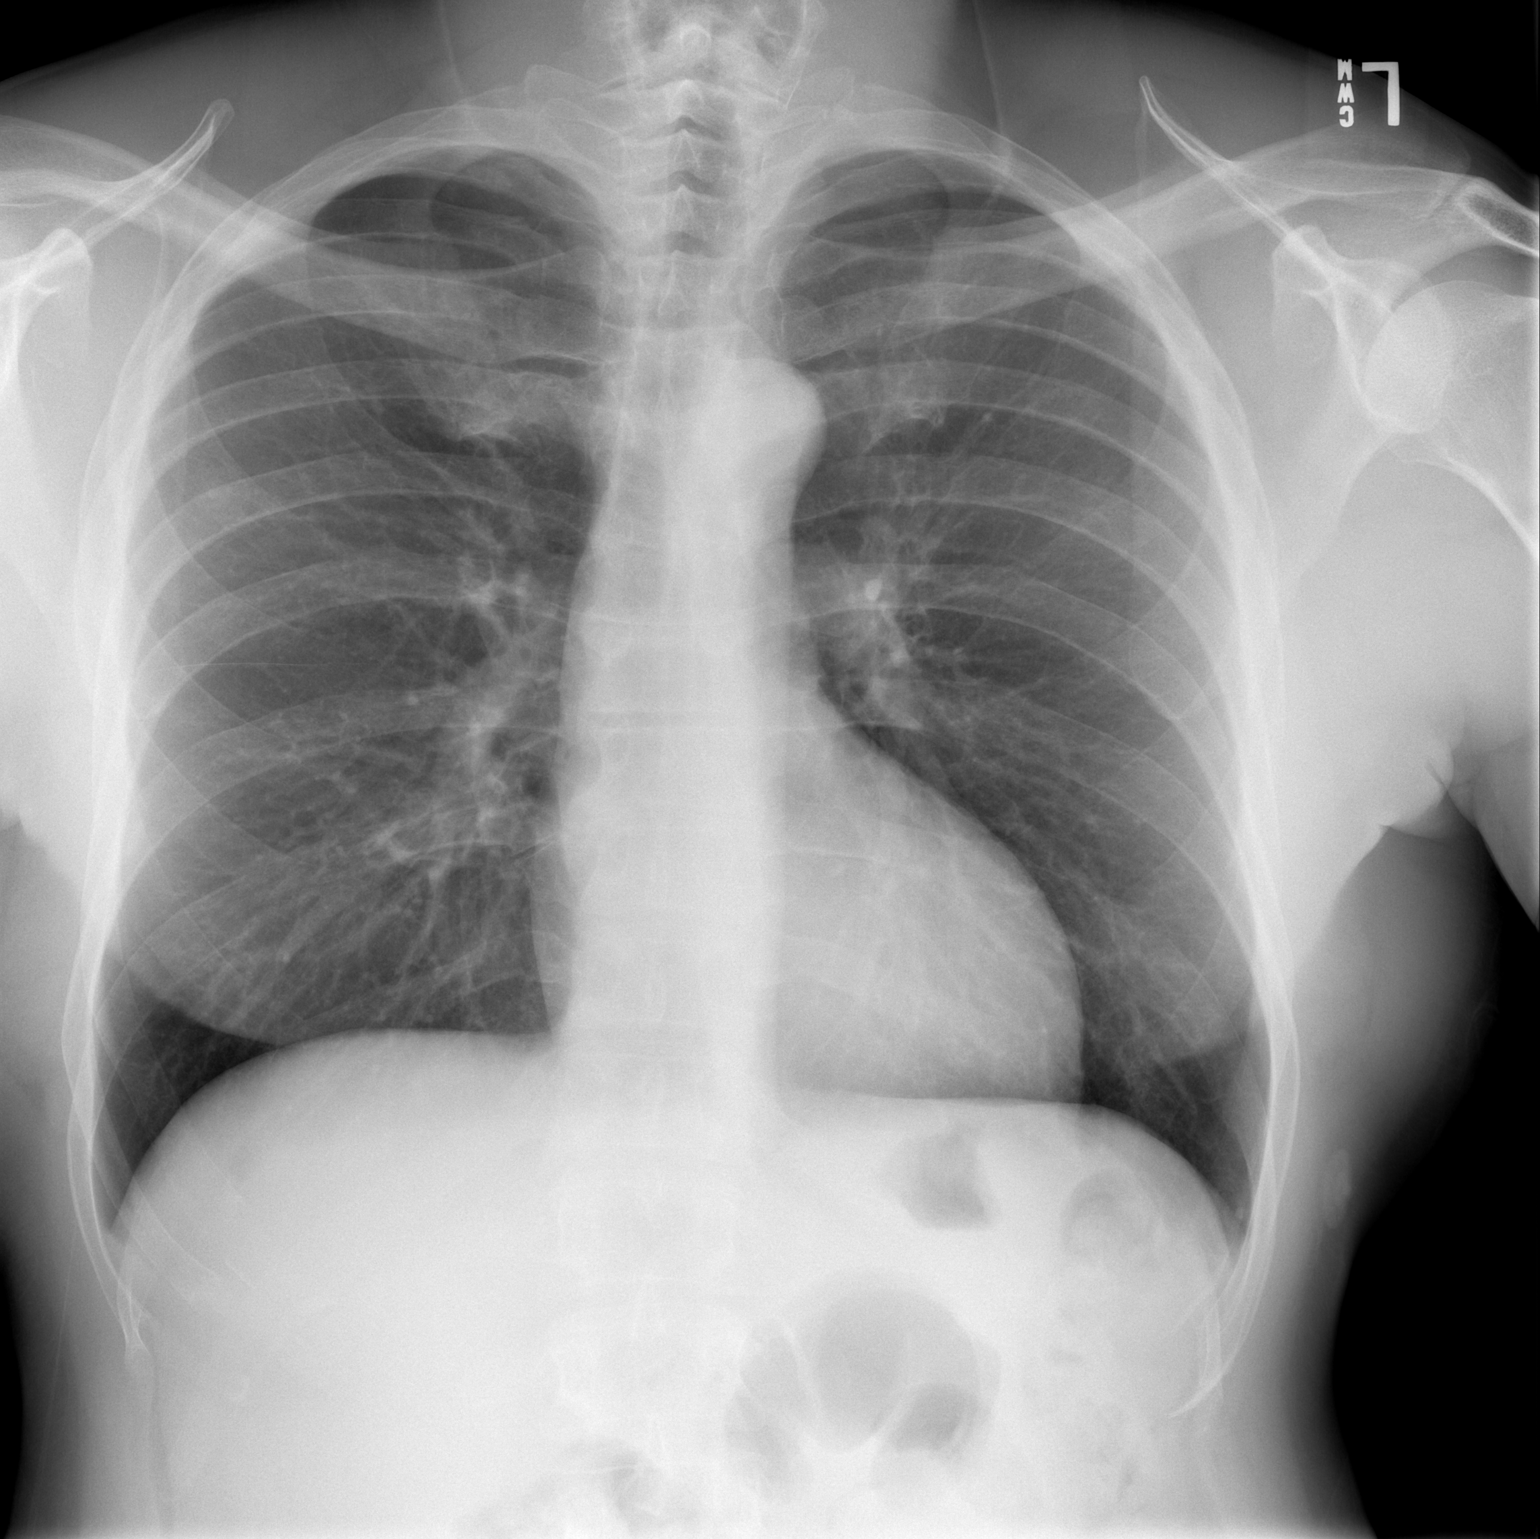

[w chest lat]
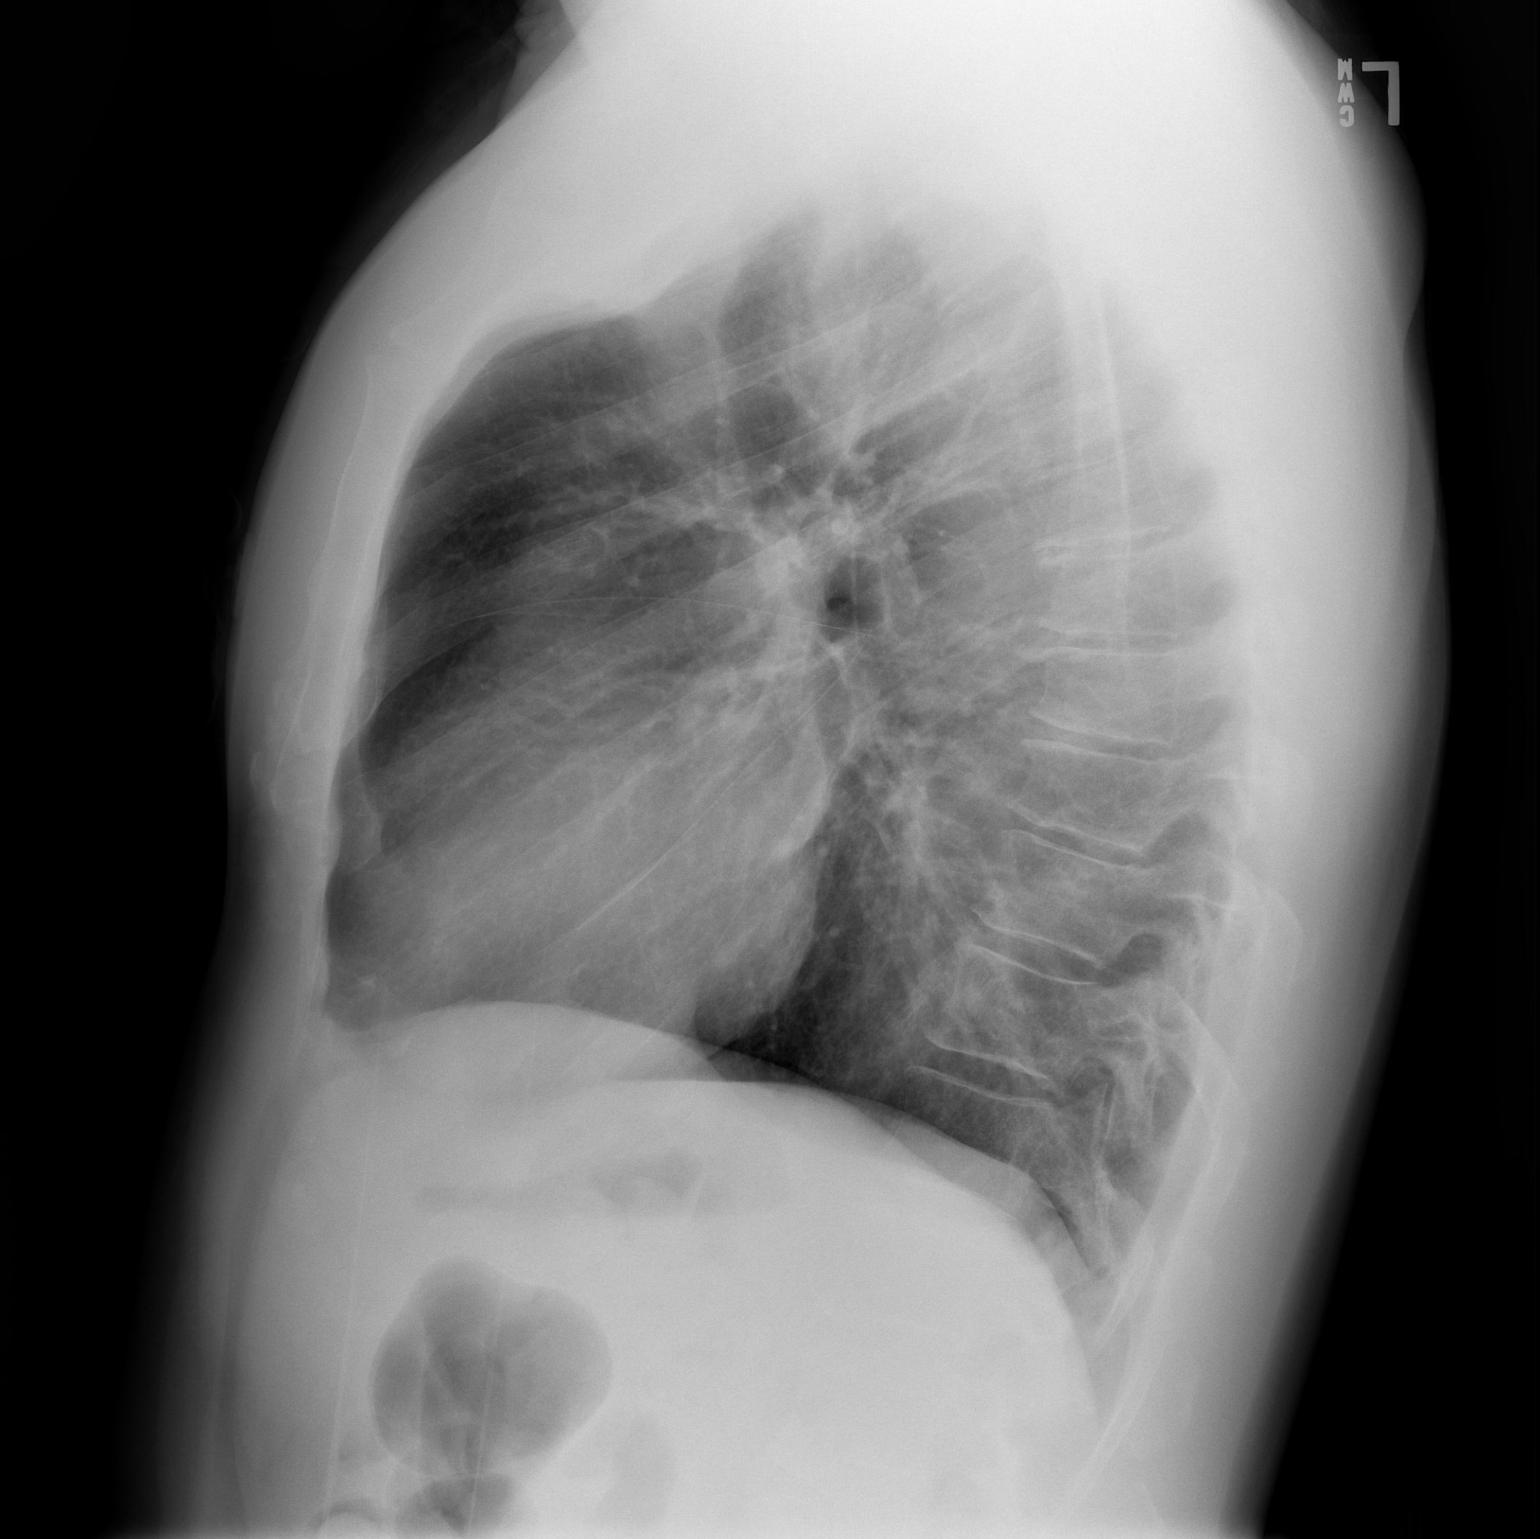

[2 of 2 positions shown; findings below may reference images not displayed]

FINDINGS: The heart size and mediastinal contours are within normal limits.
Both lungs are clear. The visualized skeletal structures are
unremarkable.
IMPRESSION: No active cardiopulmonary disease.

## 2017-07-28 MED ORDER — IBUPROFEN 400 MG PO TABS
400.0000 mg | ORAL_TABLET | Freq: Once | ORAL | Status: AC
  Administered 2017-07-28: 400 mg via ORAL
  Filled 2017-07-28: qty 1

## 2017-07-28 NOTE — ED Triage Notes (Signed)
C/o CP x 4 days-NAD-steady gait 

## 2017-07-28 NOTE — Telephone Encounter (Signed)
Pt called repforting he has been having some chest pain since Friday. Reports this as a heaviness in his chest that is persistent, nothing makes it worse not better. Pt denied any nausea, vomiting, or sweats.   Advised pt to have someone drive him to closest ER to be evaluated. Pt agreeable and his wife is going to take him now.  No further needs at this time.

## 2017-07-28 NOTE — Discharge Instructions (Signed)
It was our pleasure to provide your ER care today - we hope that you feel better.  Take motrin or aleve as need.  Follow up with cardiologist in the next 1-2 weeks.   Return to ER if worse, recurrent or persistent chest pain, trouble breathing, other concern.

## 2017-07-28 NOTE — ED Provider Notes (Signed)
[facility] EMERGENCY DEPARTMENT Provider Note   CSN: [id number] Arrival date & time: 07/28/17  1245     History   Chief Complaint Chief Complaint  Patient presents with  . Chest Pain    HPI Iden Mcguirk Montez HagemanJr. is a 57 y.o. male.  Patient c/o mid to left chest pain for past 2 days. Pain constant, dull, mild, worse w palpation. States notices more when at rest, better when up/doing things. No relation to or worsening with exertion. No associated sob, nv or diaphoresis. Pain is not pleuritic. Denies cough or uri symptoms. No fevers. No heartburn. Denies abd or back pain. No fam hx cad. Denies leg pain or swelling. Pt states he did work out/lift weights a 3-4 days ago, which was new for him - no cp while exercising - denies noting specific injury or stain while lifting.   The history is provided by the patient.  Chest Pain   Pertinent negatives include no abdominal pain, no back pain, no cough, no fever, no headaches, no palpitations and no shortness of breath.    History reviewed. No pertinent past medical history.  Patient Active Problem List   Diagnosis Date Noted  . Hyperlipidemia 10/17/2014  . Insomnia 07/01/2013  . Annual physical exam 07/01/2013  . BPH with obstruction/lower urinary tract symptoms 06/18/2012    History reviewed. No pertinent surgical history.      Home Medications    Prior to Admission medications   Medication Sig Start Date End Date Taking? Authorizing Provider  tadalafil (CIALIS) 5 MG tablet Take 1 tablet (5 mg total) by mouth daily. 12/06/16   Monica Bectonhekkekandam, Thomas J, MD  Vitamin D, Ergocalciferol, (DRISDOL) 50000 units CAPS capsule Take 1 capsule (50,000 Units total) by mouth every 7 (seven) days. Take for 8 total doses(weeks) 12/06/16   Monica Bectonhekkekandam, Thomas J, MD  zolpidem (AMBIEN) 10 MG tablet Take 1 tablet (10 mg total) by mouth at bedtime as needed for sleep. 12/06/16   Monica Bectonhekkekandam, Thomas J, MD    Family History Family History    Problem Relation Age of Onset  . Stroke Mother   . Alcohol abuse Father   . Seizures Father     Social History Social History   Tobacco Use  . Smoking status: Never Smoker  . Smokeless tobacco: Never Used  Substance Use Topics  . Alcohol use: Yes    Comment: daily  . Drug use: No     Allergies   Patient has no known allergies.   Review of Systems Review of Systems  Constitutional: Negative for fever.  HENT: Negative for sore throat.   Eyes: Negative for redness.  Respiratory: Negative for cough and shortness of breath.   Cardiovascular: Positive for chest pain. Negative for palpitations and leg swelling.  Gastrointestinal: Negative for abdominal pain.  Genitourinary: Negative for flank pain.  Musculoskeletal: Negative for back pain and neck pain.  Skin: Negative for rash.  Neurological: Negative for headaches.  Hematological: Does not bruise/bleed easily.  Psychiatric/Behavioral: Negative for confusion.     Physical Exam Updated Vital Signs BP (!) 134/92 (BP Location: Right Arm)   Pulse 70   Temp 98.3 F (36.8 C) (Oral)   Resp 18   Ht 1.829 m (6')   Wt 97 kg (213 lb 12.8 oz)   SpO2 97%   BMI 29.00 kg/m   Physical Exam  Constitutional: He appears well-developed and well-nourished.  HENT:  Mouth/Throat: Oropharynx is clear and moist.  Eyes: Conjunctivae are normal.  Neck:  Neck supple. No tracheal deviation present.  Cardiovascular: Normal rate, regular rhythm, normal heart sounds and intact distal pulses. Exam reveals no gallop and no friction rub.  No murmur heard. Pulmonary/Chest: Effort normal and breath sounds normal. No accessory muscle usage. No respiratory distress. He exhibits tenderness.  Mild chest wall tenderness reproducing symptoms.   Abdominal: Soft. Bowel sounds are normal. He exhibits no distension. There is no tenderness.  Musculoskeletal: He exhibits no edema or tenderness.  Neurological: He is alert.  Skin: Skin is warm and dry. He  is not diaphoretic.  Psychiatric: He has a normal mood and affect.  Nursing note and vitals reviewed.    ED Treatments / Results  Labs (all labs ordered are listed, but only abnormal results are displayed) Results for orders placed or performed during the hospital encounter of 07/28/17  Basic metabolic panel  Result Value Ref Range   Sodium 136 135 - 145 mmol/L   Potassium 4.0 3.5 - 5.1 mmol/L   Chloride 102 101 - 111 mmol/L   CO2 27 22 - 32 mmol/L   Glucose, Bld 97 65 - 99 mg/dL   BUN 14 6 - 20 mg/dL   Creatinine, Ser 1.61 0.61 - 1.24 mg/dL   Calcium 9.4 8.9 - 09.6 mg/dL   GFR calc non Af Amer >60 >60 mL/min   GFR calc Af Amer >60 >60 mL/min   Anion gap 7 5 - 15  CBC  Result Value Ref Range   WBC 4.0 4.0 - 10.5 K/uL   RBC 4.60 4.22 - 5.81 MIL/uL   Hemoglobin 14.3 13.0 - 17.0 g/dL   HCT 04.5 40.9 - 81.1 %   MCV 88.5 78.0 - 100.0 fL   MCH 31.1 26.0 - 34.0 pg   MCHC 35.1 30.0 - 36.0 g/dL   RDW 91.4 78.2 - 95.6 %   Platelets 216 150 - 400 K/uL  Troponin I  Result Value Ref Range   Troponin I <0.03 <0.03 ng/mL   Dg Chest 2 View  Result Date: 07/28/2017 CLINICAL DATA:  Left-sided chest pain for 4 days. EXAM: CHEST - 2 VIEW COMPARISON:  None. FINDINGS: The heart size and mediastinal contours are within normal limits. Both lungs are clear. The visualized skeletal structures are unremarkable. IMPRESSION: No active cardiopulmonary disease. Electronically Signed   By: Myles Rosenthal M.D.   On: 07/28/2017 13:10     EKG EKG Interpretation  Date/Time:  Monday July 28 2017 12:49:51 EDT Ventricular Rate:  76 PR Interval:  150 QRS Duration: 96 QT Interval:  376 QTC Calculation: 423 R Axis:   13 Text Interpretation:  Normal sinus rhythm Normal ECG Confirmed by Cathren Laine ([id number]) on 07/28/2017 1:19:50 PM   Radiology Dg Chest 2 View  Result Date: 07/28/2017 CLINICAL DATA:  Left-sided chest pain for 4 days. EXAM: CHEST - 2 VIEW COMPARISON:  None. FINDINGS: The heart size and  mediastinal contours are within normal limits. Both lungs are clear. The visualized skeletal structures are unremarkable. IMPRESSION: No active cardiopulmonary disease. Electronically Signed   By: Myles Rosenthal M.D.   On: 07/28/2017 13:10    Procedures Procedures (including critical care time)  Medications Ordered in ED Medications - No data to display   Initial Impression / Assessment and Plan / ED Course  I have reviewed the triage vital signs and the nursing notes.  Pertinent labs & imaging results that were available during my care of the patient were reviewed by me and considered in my medical decision making (  see chart for details).  Labs. Ecg. Cxr.  Reviewed nursing notes and prior charts for additional history.   After symptoms for 2 days, constant, trop is negative/normal - symptoms/exam do not appear c/w ACS, and do appear more c/w musculoskeletal/chest wall pain.   Pt currently appears stable for d/c.   Motrin po.    Final Clinical Impressions(s) / ED Diagnoses   Final diagnoses:  None    ED Discharge Orders    None       Cathren Laine, MD 07/28/17 1438

## 2017-09-09 MED FILL — TADALAFIL 5 MG TABS: 5 | 30 days supply | Qty: 30 | Fill #0

## 2017-11-10 MED FILL — TADALAFIL 5 MG TABS: 5 | 30 days supply | Qty: 30 | Fill #1

## 2017-11-17 ENCOUNTER — Telehealth: Payer: Self-pay | Admitting: Sports Medicine

## 2017-11-17 DIAGNOSIS — Z0000 Encounter for general adult medical examination without abnormal findings: Secondary | ICD-10-CM

## 2017-11-17 DIAGNOSIS — N401 Enlarged prostate with lower urinary tract symptoms: Secondary | ICD-10-CM

## 2017-11-17 DIAGNOSIS — E785 Hyperlipidemia, unspecified: Secondary | ICD-10-CM

## 2017-11-17 DIAGNOSIS — N138 Other obstructive and reflux uropathy: Secondary | ICD-10-CM

## 2017-11-17 NOTE — Telephone Encounter (Signed)
Pt advised.

## 2017-11-17 NOTE — Telephone Encounter (Signed)
Labs pended for PCP review.  

## 2017-11-17 NOTE — Telephone Encounter (Signed)
Pt has scheduled his physical for Oct 28th and wants to come by to [facility] on Friday Oct 18th.  Thanks!

## 2017-11-17 NOTE — Telephone Encounter (Signed)
Thanks

## 2017-11-17 NOTE — Telephone Encounter (Signed)
Preesh! 

## 2017-11-21 DIAGNOSIS — Z0000 Encounter for general adult medical examination without abnormal findings: Secondary | ICD-10-CM | POA: Diagnosis not present

## 2017-11-21 DIAGNOSIS — N138 Other obstructive and reflux uropathy: Secondary | ICD-10-CM | POA: Diagnosis not present

## 2017-11-21 DIAGNOSIS — E785 Hyperlipidemia, unspecified: Secondary | ICD-10-CM | POA: Diagnosis not present

## 2017-11-21 DIAGNOSIS — N401 Enlarged prostate with lower urinary tract symptoms: Secondary | ICD-10-CM | POA: Diagnosis not present

## 2017-11-22 LAB — CBC
HCT: 41.6 % (ref 38.5–50.0)
Hemoglobin: 14 g/dL (ref 13.2–17.1)
MCH: 30.3 pg (ref 27.0–33.0)
MCHC: 33.7 g/dL (ref 32.0–36.0)
MCV: 90 fL (ref 80.0–100.0)
MPV: 9.5 fL (ref 7.5–12.5)
Platelets: 246 Thousand/uL (ref 140–400)
RBC: 4.62 10*6/uL (ref 4.20–5.80)
RDW: 12.7 % (ref 11.0–15.0)
WBC: 3.2 Thousand/uL — ABNORMAL LOW (ref 3.8–10.8)

## 2017-11-22 LAB — COMPLETE METABOLIC PANEL WITHOUT GFR
AG Ratio: 1.7 (calc) (ref 1.0–2.5)
AST: 25 U/L (ref 10–35)
CO2: 30 mmol/L (ref 20–32)
Calcium: 9.3 mg/dL (ref 8.6–10.3)
GFR, Est Non African American: 69 mL/min/1.73m2 (ref >=60)
Globulin: 2.5 g/dL (ref 1.9–3.7)
Glucose, Bld: 91 mg/dL (ref 65–99)
Potassium: 4.1 mmol/L (ref 3.5–5.3)
Sodium: 138 mmol/L (ref 135–146)

## 2017-11-22 LAB — COMPLETE METABOLIC PANEL WITH GFR
ALT: 22 U/L (ref 9–46)
Albumin: 4.3 g/dL (ref 3.6–5.1)
Alkaline phosphatase (APISO): 67 U/L (ref 40–115)
BUN: 14 mg/dL (ref 7–25)
Chloride: 102 mmol/L (ref 98–110)
Creat: 1.16 mg/dL (ref 0.70–1.33)
GFR, Est African American: 80 mL/min/{1.73_m2} (ref >=60)
Total Bilirubin: 0.7 mg/dL (ref 0.2–1.2)
Total Protein: 6.8 g/dL (ref 6.1–8.1)

## 2017-11-22 LAB — HEMOGLOBIN A1C
Hgb A1c MFr Bld: 5.5 %{Hb} (ref <5.7)
Mean Plasma Glucose: 111 (calc)
eAG (mmol/L): 6.2 (calc)

## 2017-11-22 LAB — LIPID PANEL
Cholesterol: 211 mg/dL — ABNORMAL HIGH (ref <200)
HDL: 57 mg/dL (ref >40)
LDL Cholesterol (Calc): 140 mg/dL (calc) — ABNORMAL HIGH
Non-HDL Cholesterol (Calc): 154 mg/dL — ABNORMAL HIGH (ref <130)
Total CHOL/HDL Ratio: 3.7 (calc) (ref <5.0)
Triglycerides: 50 mg/dL (ref <150)

## 2017-11-22 LAB — TSH: TSH: 0.73 mIU/L (ref 0.40–4.50)

## 2017-11-22 LAB — PSA: PSA: 0.4 ng/mL (ref <=4.0)

## 2017-12-01 ENCOUNTER — Encounter: Payer: Self-pay | Admitting: Sports Medicine

## 2017-12-01 ENCOUNTER — Ambulatory Visit (INDEPENDENT_AMBULATORY_CARE_PROVIDER_SITE_OTHER): Payer: BLUE CROSS/BLUE SHIELD | Admitting: Sports Medicine

## 2017-12-01 VITALS — BP 129/79 | HR 73 | Ht 72.0 in | Wt 215.0 lb

## 2017-12-01 DIAGNOSIS — Z0000 Encounter for general adult medical examination without abnormal findings: Secondary | ICD-10-CM

## 2017-12-01 DIAGNOSIS — M940 Chondrocostal junction syndrome [Tietze]: Secondary | ICD-10-CM | POA: Insufficient documentation

## 2017-12-01 DIAGNOSIS — E785 Hyperlipidemia, unspecified: Secondary | ICD-10-CM

## 2017-12-01 DIAGNOSIS — Z23 Encounter for immunization: Secondary | ICD-10-CM

## 2017-12-01 MED ORDER — ATORVASTATIN CALCIUM 10 MG PO TABS: 10.0000 mg | ORAL_TABLET | Freq: Every day | ORAL | 3 refills | Status: DC

## 2017-12-01 MED ORDER — MELOXICAM 15 MG PO TABS: ORAL_TABLET | ORAL | 3 refills | Status: DC

## 2017-12-01 MED FILL — MELOXICAM 15 MG TABLET: 15 | 30 days supply | Qty: 30 | Fill #0

## 2017-12-01 MED FILL — ATORVASTATIN 10 MG TABLET: 10 | 30 days supply | Qty: 30 | Fill #0

## 2017-12-01 NOTE — Assessment & Plan Note (Signed)
Left second and third costal cartilage. Meloxicam daily for 2 weeks, icing 20 minutes 3-4 times per day. Chest x-ray was normal and a recent ED visit.

## 2017-12-01 NOTE — Progress Notes (Signed)
Subjective:    CC: Annual physical exam  HPI:  This is a pleasant 57 year old male here for his physical, he recently had labs done, normal except for hyperlipidemia.  At this point he is agreeable to try cholesterol medication.  Chest wall pain: Was recently seen in the ED for this, chest x-ray normal, ruled out for ACS.  Pain is localized just left of the sternum and an inch distal to the sternoclavicular joint.  He does have a bit of swelling and redness here.  Pain is reproducible with palpation.  No fevers, chills, no trauma, no pleuritic pain.  No shortness of breath.  Pain is not exertional.  Not relieved by rest.  No nausea or diaphoresis.  It is fairly constant.  I reviewed the past medical history, family history, social history, surgical history, and allergies today and no changes were needed.  Please see the problem list section below in epic for further details.  Past Medical History: No past medical history on file. Past Surgical History: No past surgical history on file. Social History: Social History   Socioeconomic History  . Marital status: Married    Spouse name: Not on file  . Number of children: Not on file  . Years of education: Not on file  . Highest education level: Not on file  Occupational History  . Not on file  Social Needs  . Financial resource strain: Not on file  . Food insecurity:    Worry: Not on file    Inability: Not on file  . Transportation needs:    Medical: Not on file    Non-medical: Not on file  Tobacco Use  . Smoking status: Never Smoker  . Smokeless tobacco: Never Used  Substance and Sexual Activity  . Alcohol use: Yes    Comment: daily  . Drug use: No  . Sexual activity: Not on file  Lifestyle  . Physical activity:    Days per week: Not on file    Minutes per session: Not on file  . Stress: Not on file  Relationships  . Social connections:    Talks on phone: Not on file    Gets together: Not on file    Attends religious  service: Not on file    Active member of club or organization: Not on file    Attends meetings of clubs or organizations: Not on file    Relationship status: Not on file  Other Topics Concern  . Not on file  Social History Narrative  . Not on file   Family History: Family History  Problem Relation Age of Onset  . Stroke Mother   . Alcohol abuse Father   . Seizures Father    Allergies: No Known Allergies Medications: See med rec.  Review of Systems: No headache, visual changes, nausea, vomiting, diarrhea, constipation, dizziness, abdominal pain, skin rash, fevers, chills, night sweats, swollen lymph nodes, weight loss, chest pain, body aches, joint swelling, muscle aches, shortness of breath, mood changes, visual or auditory hallucinations.  Objective:    General: Well Developed, well nourished, and in no acute distress.  Neuro: Alert and oriented x3, extra-ocular muscles intact, sensation grossly intact. Cranial nerves II through XII are intact, motor, sensory, and coordinative functions are all intact. HEENT: Normocephalic, atraumatic, pupils equal round reactive to light, neck supple, no masses, no lymphadenopathy, thyroid nonpalpable. Oropharynx, nasopharynx, external ear canals are unremarkable. Skin: Warm and dry, no rashes noted.  Cardiac: Regular rate and rhythm, no murmurs rubs or  gallops.  Respiratory: Clear to auscultation bilaterally. Not using accessory muscles, speaking in full sentences.  Abdominal: Soft, nontender, nondistended, positive bowel sounds, no masses, no organomegaly.  Musculoskeletal: Shoulder, elbow, wrist, hip, knee, ankle stable, and with full range of motion.  Fullness in the left upper anterior chest wall at the second or third costal cartilage at the sternocostal junction.  No reproduction of pain with firing of the pectoralis major.  Impression and Recommendations:    The patient was counselled, risk factors were discussed, anticipatory guidance  given.  Annual physical exam Annual physical with flu shot. Return in 1 year.  Hyperlipidemia Starting low-dose atorvastatin, recheck in 2 to 3 months.  Costochondritis Left second and third costal cartilage. Meloxicam daily for 2 weeks, icing 20 minutes 3-4 times per day. Chest x-ray was normal and a recent ED visit. ___________________________________________ [name]. Benjamin Stain, M.D., ABFM., CAQSM. Primary Care and Sports Medicine [place] [facility]  Adjunct Professor of Family Medicine  [employer]

## 2017-12-01 NOTE — Assessment & Plan Note (Signed)
Annual physical with flu shot. Return in 1 year.

## 2017-12-01 NOTE — Patient Instructions (Signed)
Costochondritis Costochondritis is swelling and irritation (inflammation) of the tissue (cartilage) that connects your ribs to your breastbone (sternum). This causes pain in the front of your chest. The pain usually starts gradually and involves more than one rib. What are the causes? The exact cause of this condition is not always known. It results from stress on the cartilage where your ribs attach to your sternum. The cause of this stress could be:  Chest injury (trauma).  Exercise or activity, such as lifting.  Severe coughing.  What increases the risk? You may be at higher risk for this condition if you:  Are male.  Are 30?58 years old.  Recently started a new exercise or work activity.  Have low levels of vitamin D.  Have a condition that makes you cough frequently.  What are the signs or symptoms? The main symptom of this condition is chest pain. The pain:  Usually starts gradually and can be sharp or dull.  Gets worse with deep breathing, coughing, or exercise.  Gets better with rest.  May be worse when you press on the sternum-rib connection (tenderness).  How is this diagnosed? This condition is diagnosed based on your symptoms, medical history, and a physical exam. Your health care provider will check for tenderness when pressing on your sternum. This is the most important finding. You may also have tests to rule out other causes of chest pain. These may include:  A chest X-ray to check for lung problems.  An electrocardiogram (ECG) to see if you have a heart problem that could be causing the pain.  An imaging scan to rule out a chest or rib fracture.  How is this treated? This condition usually goes away on its own over time. Your health care provider may prescribe an NSAID to reduce pain and inflammation. Your health care provider may also suggest that you:  Rest and avoid activities that make pain worse.  Apply heat or cold to the area to reduce pain  and inflammation.  Do exercises to stretch your chest muscles.  If these treatments do not help, your health care provider may inject a numbing medicine at the sternum-rib connection to help relieve the pain. Follow these instructions at home:  Avoid activities that make pain worse. This includes any activities that use chest, abdominal, and side muscles.  If directed, put ice on the painful area: ? Put ice in a plastic bag. ? Place a towel between your skin and the bag. ? Leave the ice on for 20 minutes, 2-3 times a day.  If directed, apply heat to the affected area as often as told by your health care provider. Use the heat source that your health care provider recommends, such as a moist heat pack or a heating pad. ? Place a towel between your skin and the heat source. ? Leave the heat on for 20-30 minutes. ? Remove the heat if your skin turns bright red. This is especially important if you are unable to feel pain, heat, or cold. You may have a greater risk of getting burned.  Take over-the-counter and prescription medicines only as told by your health care provider.  Return to your normal activities as told by your health care provider. Ask your health care provider what activities are safe for you.  Keep all follow-up visits as told by your health care provider. This is important. Contact a health care provider if:  You have chills or a fever.  Your pain does not go   away or it gets worse.  You have a cough that does not go away (is persistent). Get help right away if:  You have shortness of breath. This information is not intended to replace advice given to you by your health care provider. Make sure you discuss any questions you have with your health care provider. Document Released: 10/31/2004 Document Revised: 08/11/2015 Document Reviewed: 05/17/2015 Elsevier Interactive Patient Education  2018 Elsevier Inc.   

## 2017-12-01 NOTE — Assessment & Plan Note (Signed)
Starting low-dose atorvastatin, recheck in 2 to 3 months.

## 2017-12-29 ENCOUNTER — Encounter: Payer: Self-pay | Admitting: Sports Medicine

## 2017-12-29 ENCOUNTER — Ambulatory Visit (INDEPENDENT_AMBULATORY_CARE_PROVIDER_SITE_OTHER): Payer: BLUE CROSS/BLUE SHIELD | Admitting: Sports Medicine

## 2017-12-29 ENCOUNTER — Other Ambulatory Visit: Payer: Self-pay | Admitting: Sports Medicine

## 2017-12-29 DIAGNOSIS — G4700 Insomnia, unspecified: Secondary | ICD-10-CM

## 2017-12-29 DIAGNOSIS — M940 Chondrocostal junction syndrome [Tietze]: Secondary | ICD-10-CM | POA: Diagnosis not present

## 2017-12-29 DIAGNOSIS — N138 Other obstructive and reflux uropathy: Secondary | ICD-10-CM

## 2017-12-29 DIAGNOSIS — N401 Enlarged prostate with lower urinary tract symptoms: Principal | ICD-10-CM

## 2017-12-29 MED ORDER — ZOLPIDEM TARTRATE 10 MG PO TABS: 10.0000 mg | ORAL_TABLET | Freq: Every evening | ORAL | 3 refills | Status: DC | PRN

## 2017-12-29 MED ORDER — PREDNISONE 50 MG PO TABS: ORAL_TABLET | ORAL | 0 refills | Status: DC

## 2017-12-29 MED FILL — predniSONE 50 MG TABS: 50 | 5 days supply | Qty: 5 | Fill #0

## 2017-12-29 MED FILL — ZOLPIDEM TARTRATE 10 MG TAB: 10 | 30 days supply | Qty: 30 | Fill #0

## 2017-12-29 MED FILL — MELOXICAM 15 MG TABLET: 15 | 30 days supply | Qty: 30 | Fill #1

## 2017-12-29 MED FILL — ATORVASTATIN 10 MG TABLET: 10 | 30 days supply | Qty: 30 | Fill #1

## 2017-12-29 NOTE — Assessment & Plan Note (Signed)
Left second and third costal cartilages. No real improvement with meloxicam for 2 weeks, icing. Switching to prednisone for 5 days, he can return to see me 2 to 4 weeks afterwards. If persistent discomfort we will consider steroid injection around the costal cartilage.

## 2017-12-29 NOTE — Progress Notes (Signed)
  Subjective:    CC: Follow-up  HPI: This is a pleasant 57 year old male, we treated him for left costochondritis the last visit, meloxicam for the most part was not helpful.  He has a bit of discomfort at the left costal chondral junction.  I reviewed the past medical history, family history, social history, surgical history, and allergies today and no changes were needed.  Please see the problem list section below in epic for further details.  Past Medical History: No past medical history on file. Past Surgical History: No past surgical history on file. Social History: Social History   Socioeconomic History  . Marital status: Married    Spouse name: Not on file  . Number of children: Not on file  . Years of education: Not on file  . Highest education level: Not on file  Occupational History  . Not on file  Social Needs  . Financial resource strain: Not on file  . Food insecurity:    Worry: Not on file    Inability: Not on file  . Transportation needs:    Medical: Not on file    Non-medical: Not on file  Tobacco Use  . Smoking status: Never Smoker  . Smokeless tobacco: Never Used  Substance and Sexual Activity  . Alcohol use: Yes    Comment: daily  . Drug use: No  . Sexual activity: Not on file  Lifestyle  . Physical activity:    Days per week: Not on file    Minutes per session: Not on file  . Stress: Not on file  Relationships  . Social connections:    Talks on phone: Not on file    Gets together: Not on file    Attends religious service: Not on file    Active member of club or organization: Not on file    Attends meetings of clubs or organizations: Not on file    Relationship status: Not on file  Other Topics Concern  . Not on file  Social History Narrative  . Not on file   Family History: Family History  Problem Relation Age of Onset  . Stroke Mother   . Alcohol abuse Father   . Seizures Father    Allergies: No Known Allergies Medications: See  med rec.  Review of Systems: No fevers, chills, night sweats, weight loss, chest pain, or shortness of breath.   Objective:    General: Well Developed, well nourished, and in no acute distress.  Neuro: Alert and oriented x3, extra-ocular muscles intact, sensation grossly intact.  HEENT: Normocephalic, atraumatic, pupils equal round reactive to light, neck supple, no masses, no lymphadenopathy, thyroid nonpalpable.  Skin: Warm and dry, no rashes. Cardiac: Regular rate and rhythm, no murmurs rubs or gallops, no lower extremity edema.  Respiratory: Clear to auscultation bilaterally. Not using accessory muscles, speaking in full sentences.  Impression and Recommendations:    Costochondritis Left second and third costal cartilages. No real improvement with meloxicam for 2 weeks, icing. Switching to prednisone for 5 days, he can return to see me 2 to 4 weeks afterwards. If persistent discomfort we will consider steroid injection around the costal cartilage.  ___________________________________________ Ihor Austinhomas J. Benjamin Stainhekkekandam, M.D., ABFM., CAQSM. Primary Care and Sports Medicine [place] [facility]  Adjunct Professor of Family Medicine  [employer]

## 2017-12-30 ENCOUNTER — Other Ambulatory Visit: Payer: Self-pay | Admitting: Sports Medicine

## 2017-12-30 DIAGNOSIS — N138 Other obstructive and reflux uropathy: Secondary | ICD-10-CM

## 2017-12-30 DIAGNOSIS — N401 Enlarged prostate with lower urinary tract symptoms: Principal | ICD-10-CM

## 2018-01-23 MED FILL — MELOXICAM 15 MG TABLET: 15 | 30 days supply | Qty: 30 | Fill #2

## 2018-01-23 MED FILL — ATORVASTATIN 10 MG TABLET: 10 | 30 days supply | Qty: 30 | Fill #2

## 2018-01-26 ENCOUNTER — Other Ambulatory Visit: Payer: Self-pay

## 2018-01-26 DIAGNOSIS — N138 Other obstructive and reflux uropathy: Secondary | ICD-10-CM

## 2018-01-26 DIAGNOSIS — E785 Hyperlipidemia, unspecified: Secondary | ICD-10-CM

## 2018-01-26 DIAGNOSIS — N401 Enlarged prostate with lower urinary tract symptoms: Principal | ICD-10-CM

## 2018-01-26 DIAGNOSIS — H04123 Dry eye syndrome of bilateral lacrimal glands: Secondary | ICD-10-CM | POA: Diagnosis not present

## 2018-01-26 MED ORDER — ATORVASTATIN CALCIUM 10 MG PO TABS: 10.0000 mg | ORAL_TABLET | Freq: Every day | ORAL | 2 refills | Status: DC

## 2018-01-26 MED ORDER — TADALAFIL 5 MG PO TABS: 5.0000 mg | ORAL_TABLET | Freq: Every day | ORAL | 2 refills | Status: DC

## 2018-01-29 ENCOUNTER — Other Ambulatory Visit: Payer: Self-pay | Admitting: Sports Medicine

## 2018-01-29 DIAGNOSIS — N138 Other obstructive and reflux uropathy: Secondary | ICD-10-CM

## 2018-01-29 DIAGNOSIS — E785 Hyperlipidemia, unspecified: Secondary | ICD-10-CM

## 2018-01-29 DIAGNOSIS — N401 Enlarged prostate with lower urinary tract symptoms: Secondary | ICD-10-CM

## 2018-01-29 MED ORDER — ATORVASTATIN CALCIUM 10 MG PO TABS: 10.0000 mg | ORAL_TABLET | Freq: Every day | ORAL | 3 refills | Status: DC

## 2018-01-29 MED ORDER — TADALAFIL 5 MG PO TABS: 5.0000 mg | ORAL_TABLET | Freq: Every day | ORAL | 3 refills | Status: DC

## 2018-04-23 ENCOUNTER — Encounter: Payer: Self-pay | Admitting: Sports Medicine

## 2018-04-23 DIAGNOSIS — N401 Enlarged prostate with lower urinary tract symptoms: Principal | ICD-10-CM

## 2018-04-23 DIAGNOSIS — N138 Other obstructive and reflux uropathy: Secondary | ICD-10-CM

## 2018-04-23 MED ORDER — TADALAFIL 5 MG PO TABS: 5.0000 mg | ORAL_TABLET | Freq: Every day | ORAL | 3 refills | Status: DC

## 2018-10-16 ENCOUNTER — Telehealth: Payer: Self-pay | Admitting: Sports Medicine

## 2018-10-16 DIAGNOSIS — N401 Enlarged prostate with lower urinary tract symptoms: Secondary | ICD-10-CM

## 2018-10-16 DIAGNOSIS — E785 Hyperlipidemia, unspecified: Secondary | ICD-10-CM

## 2018-10-16 DIAGNOSIS — N138 Other obstructive and reflux uropathy: Secondary | ICD-10-CM

## 2018-10-16 NOTE — Telephone Encounter (Signed)
Patient scheduled a physical for 11/11/2018 and was wondering if he could possibly get his labs done around September 18th or September 21st. Please Advise.

## 2018-10-16 NOTE — Telephone Encounter (Signed)
No problem, orders placed for routine labs.  He should get them fasting.

## 2018-10-16 NOTE — Telephone Encounter (Signed)
Called patient and notified via VM that labs sent. KG LPN

## 2018-10-22 ENCOUNTER — Ambulatory Visit (INDEPENDENT_AMBULATORY_CARE_PROVIDER_SITE_OTHER): Payer: BC Managed Care – PPO

## 2018-10-22 ENCOUNTER — Encounter: Payer: Self-pay | Admitting: Sports Medicine

## 2018-10-22 ENCOUNTER — Other Ambulatory Visit: Payer: Self-pay

## 2018-10-22 ENCOUNTER — Ambulatory Visit (INDEPENDENT_AMBULATORY_CARE_PROVIDER_SITE_OTHER): Payer: BC Managed Care – PPO | Admitting: Sports Medicine

## 2018-10-22 DIAGNOSIS — M7552 Bursitis of left shoulder: Secondary | ICD-10-CM

## 2018-10-22 DIAGNOSIS — M25512 Pain in left shoulder: Secondary | ICD-10-CM | POA: Diagnosis not present

## 2018-10-22 DIAGNOSIS — G8929 Other chronic pain: Secondary | ICD-10-CM | POA: Insufficient documentation

## 2018-10-22 DIAGNOSIS — M25511 Pain in right shoulder: Secondary | ICD-10-CM | POA: Insufficient documentation

## 2018-10-22 IMAGING — DX DG SHOULDER 2+V*L*
3 series · 3 of 3 positions shown · non-contrast
Comparison: None.

CLINICAL DATA: Left shoulder pain for few weeks

EXAM:
LEFT SHOULDER - 2+ VIEW

[shoulder grashey]
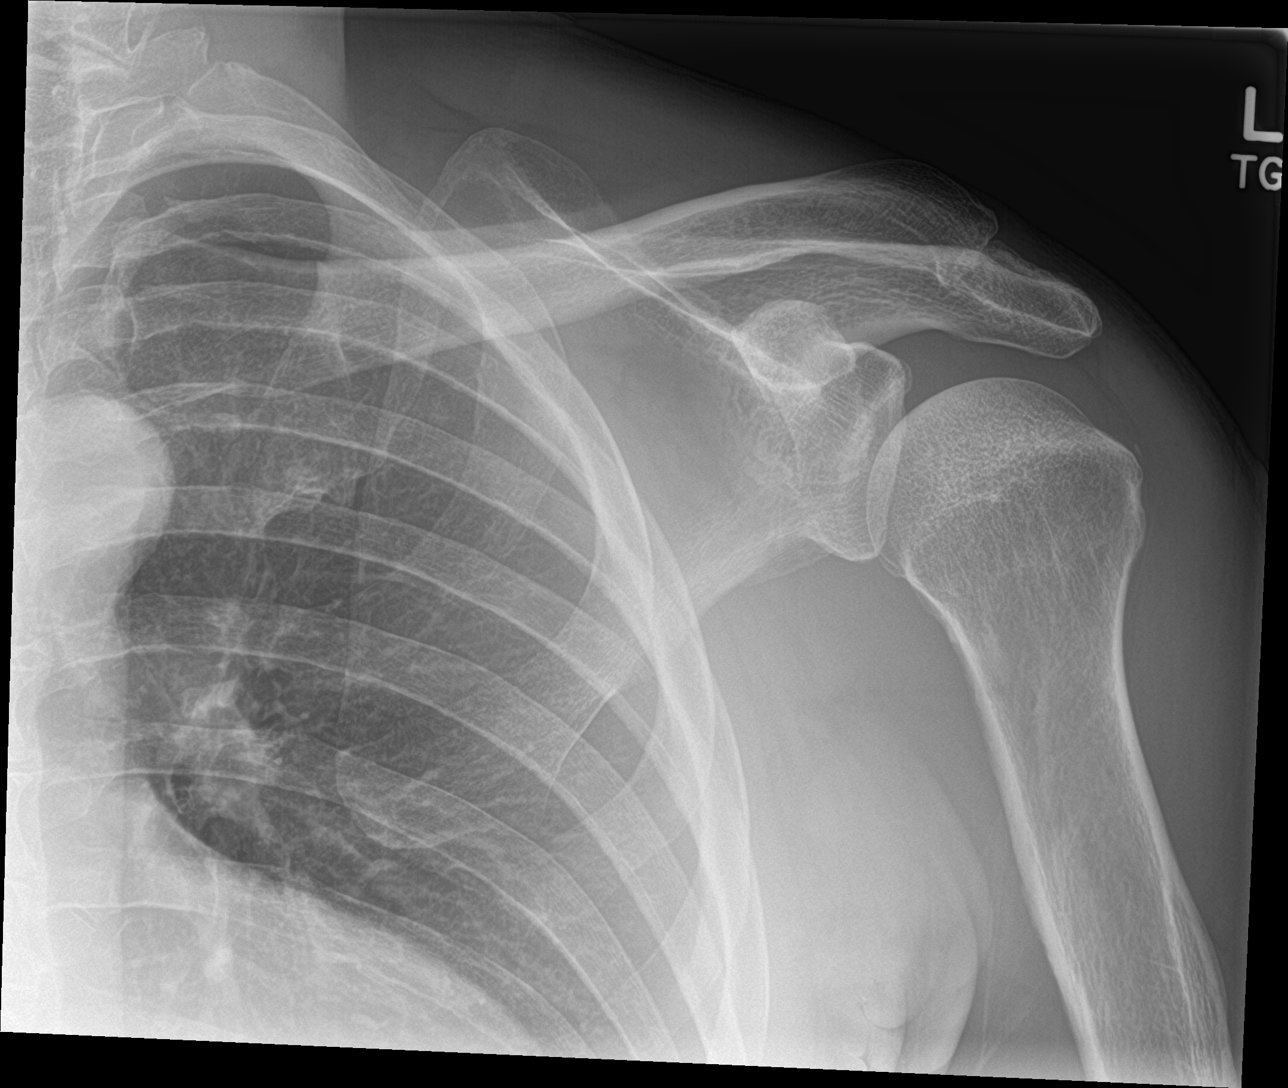

[shoulder y view]
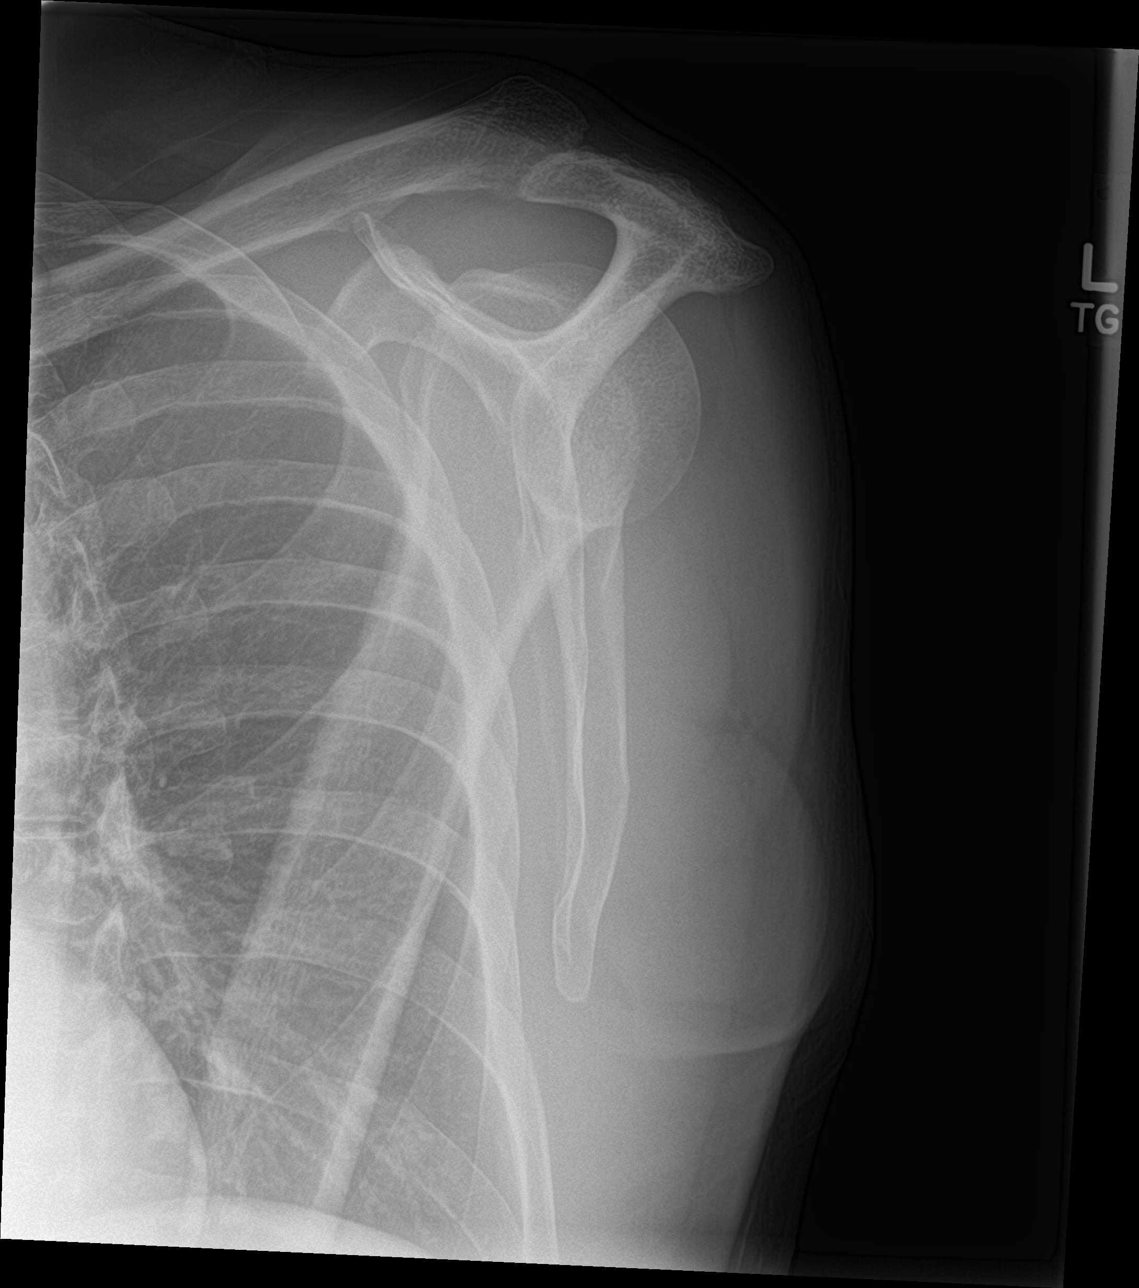

[shoulder axillary]
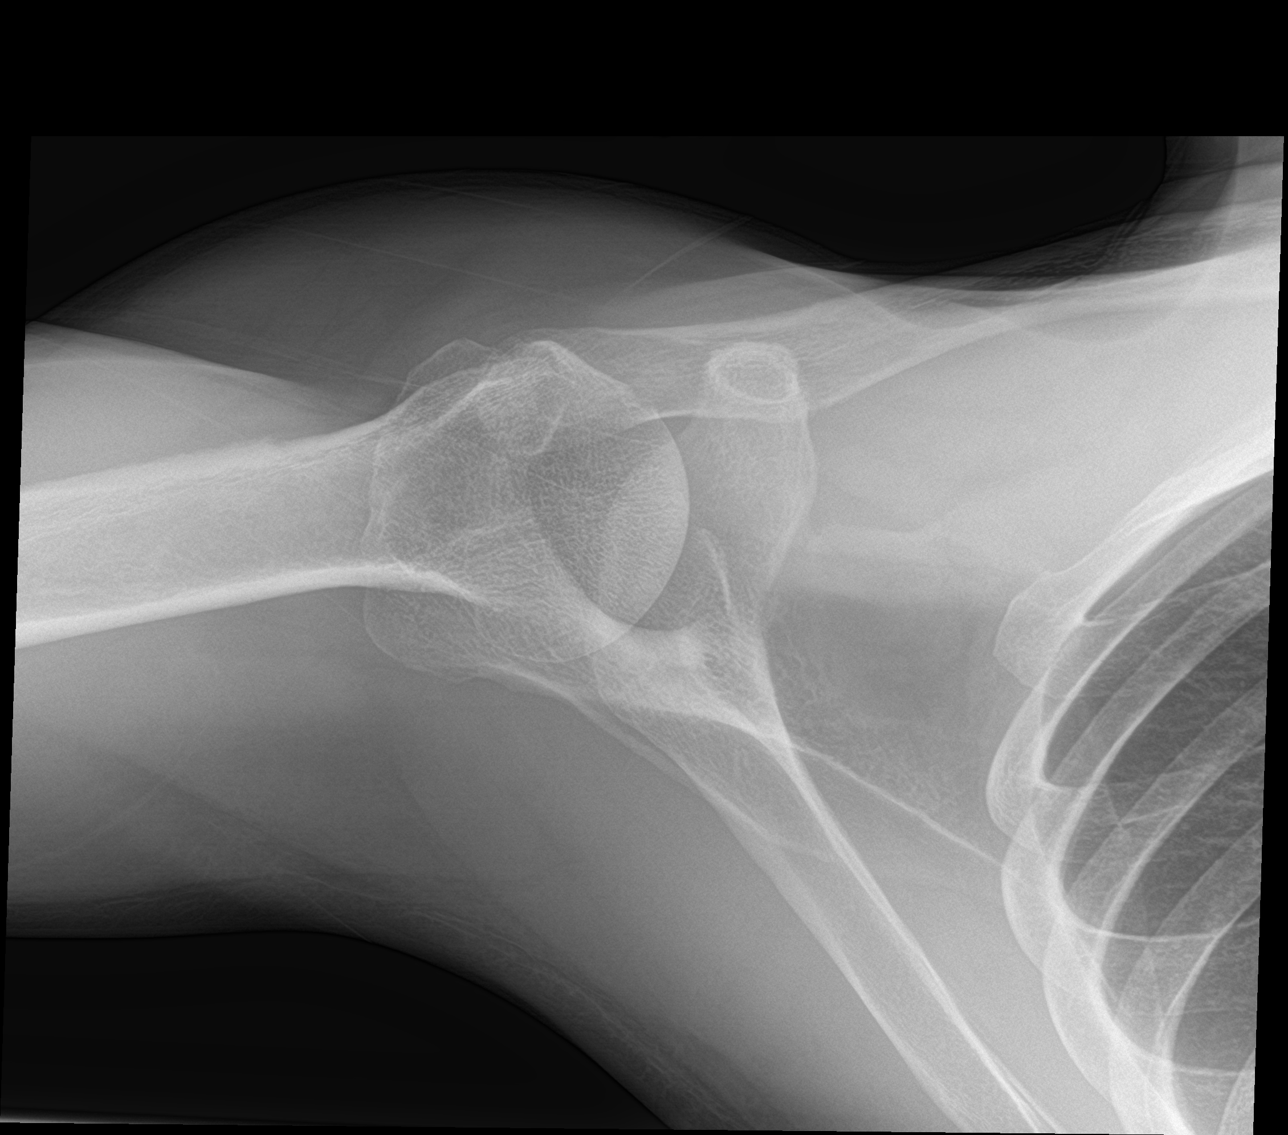

[3 of 3 positions shown; findings below may reference images not displayed]

FINDINGS: No fracture or dislocation. Normal bone mineralization seen
throughout. No overlying soft tissue swelling.
IMPRESSION: Negative.

## 2018-10-22 NOTE — Assessment & Plan Note (Signed)
With severe pain, subacromial injection, cuff looked intact. X-rays, formal PT. Return to see me in 6 weeks.

## 2018-10-22 NOTE — Progress Notes (Signed)
Subjective:    CC: Severe left shoulder pain  HPI: This is a pleasant 58 year old male, for the past couple weeks after trying to do some rose he is had severe pain in his left shoulder, localized over the deltoid and worse with overhead activities.  Waking him from sleep, persistent.  I reviewed the past medical history, family history, social history, surgical history, and allergies today and no changes were needed.  Please see the problem list section below in epic for further details.  Past Medical History: No past medical history on file. Past Surgical History: No past surgical history on file. Social History: Social History   Socioeconomic History  . Marital status: Married    Spouse name: Not on file  . Number of children: Not on file  . Years of education: Not on file  . Highest education level: Not on file  Occupational History  . Not on file  Social Needs  . Financial resource strain: Not on file  . Food insecurity    Worry: Not on file    Inability: Not on file  . Transportation needs    Medical: Not on file    Non-medical: Not on file  Tobacco Use  . Smoking status: Never Smoker  . Smokeless tobacco: Never Used  Substance and Sexual Activity  . Alcohol use: Yes    Comment: daily  . Drug use: No  . Sexual activity: Not on file  Lifestyle  . Physical activity    Days per week: Not on file    Minutes per session: Not on file  . Stress: Not on file  Relationships  . Social Herbalist on phone: Not on file    Gets together: Not on file    Attends religious service: Not on file    Active member of club or organization: Not on file    Attends meetings of clubs or organizations: Not on file    Relationship status: Not on file  Other Topics Concern  . Not on file  Social History Narrative  . Not on file   Family History: Family History  Problem Relation Age of Onset  . Stroke Mother   . Alcohol abuse Father   . Seizures Father     Allergies: No Known Allergies Medications: See med rec.  Review of Systems: No fevers, chills, night sweats, weight loss, chest pain, or shortness of breath.   Objective:    General: Well Developed, well nourished, and in no acute distress.  Neuro: Alert and oriented x3, extra-ocular muscles intact, sensation grossly intact.  HEENT: Normocephalic, atraumatic, pupils equal round reactive to light, neck supple, no masses, no lymphadenopathy, thyroid nonpalpable.  Skin: Warm and dry, no rashes. Cardiac: Regular rate and rhythm, no murmurs rubs or gallops, no lower extremity edema.  Respiratory: Clear to auscultation bilaterally. Not using accessory muscles, speaking in full sentences. Left shoulder: Inspection reveals no abnormalities, atrophy or asymmetry. Palpation is normal with no tenderness over AC joint or bicipital groove. ROM is full in all planes. Rotator cuff strength weak throughout. Positive Neer and Hawkin's tests, empty can. Speeds and Yergason's tests normal. No labral pathology noted with negative Obrien's, negative crank, negative clunk, and good stability. Normal scapular function observed. No painful arc and no drop arm sign. No apprehension sign  Procedure: Real-time Ultrasound Guided injection of the left subacromial bursa Device: GE Logiq E  Verbal informed consent obtained.  Time-out conducted.  Noted no overlying erythema, induration, or other  signs of local infection.  Skin prepped in a sterile fashion.  Local anesthesia: Topical Ethyl chloride.  With sterile technique and under real time ultrasound guidance:  1 cc Kenalog 40, 1 cc lidocaine, 1 cc bupivacaine injected easily Completed without difficulty  Pain immediately resolved suggesting accurate placement of the medication.  Advised to call if fevers/chills, erythema, induration, drainage, or persistent bleeding.  Images permanently stored and available for review in the ultrasound unit.   Impression: Technically successful ultrasound guided injection.  Impression and Recommendations:    Acute shoulder bursitis, left With severe pain, subacromial injection, cuff looked intact. X-rays, formal PT. Return to see me in 6 weeks.   ___________________________________________ Ihor Austinhomas J. Benjamin Stainhekkekandam, M.D., ABFM., CAQSM. Primary Care and Sports Medicine  [facility]  Adjunct Professor of Family Medicine  [employer]

## 2018-10-28 ENCOUNTER — Other Ambulatory Visit: Payer: Self-pay | Admitting: Sports Medicine

## 2018-10-28 ENCOUNTER — Ambulatory Visit (INDEPENDENT_AMBULATORY_CARE_PROVIDER_SITE_OTHER): Payer: BC Managed Care – PPO | Admitting: Rehabilitative and Restorative Service Providers"

## 2018-10-28 ENCOUNTER — Encounter: Payer: Self-pay | Admitting: Rehabilitative and Restorative Service Providers"

## 2018-10-28 ENCOUNTER — Other Ambulatory Visit: Payer: Self-pay

## 2018-10-28 DIAGNOSIS — E785 Hyperlipidemia, unspecified: Secondary | ICD-10-CM

## 2018-10-28 DIAGNOSIS — G2589 Other specified extrapyramidal and movement disorders: Secondary | ICD-10-CM | POA: Diagnosis not present

## 2018-10-28 DIAGNOSIS — R29898 Other symptoms and signs involving the musculoskeletal system: Secondary | ICD-10-CM | POA: Diagnosis not present

## 2018-10-28 DIAGNOSIS — M25512 Pain in left shoulder: Secondary | ICD-10-CM

## 2018-10-28 NOTE — Patient Instructions (Signed)
Access Code: [email or web address]  URL: [email or web address]  Date: 10/28/2018  Prepared by: Gillermo Murdoch   Exercises  Seated Cervical Retraction - 10 reps - 1 sets - 3x daily - 7x weekly  Standing Scapular Retraction - 10 reps - 1 sets - 10 hold - 3x daily - 7x weekly  Shoulder External Rotation and Scapular Retraction - 10 reps - 1 sets - hold - 3x daily - 7x weekly  Seated Shoulder W - 10 reps - 1 sets - 2-3sec hold - 2x daily - 7x weekly  Doorway Pec Stretch at 60 Degrees Abduction - 3 reps - 1 sets - 3x daily - 7x weekly  Doorway Pec Stretch at 90 Degrees Abduction - 3 reps - 1 sets - 30 seconds hold - 3x daily - 7x weekly  Doorway Pec Stretch at 120 Degrees Abduction - 3 reps - 1 sets - 30 second hold hold - 3x daily - 7x weekly  Seated Shoulder Flexion AAROM with Pulley Behind - 10 reps - 1 sets - 10 sec hold - 2x daily - 7x weekly  Seated Shoulder Scaption AAROM with Pulley at Side - 10 reps - 1 sets - 10sec hold - 2x daily - 7x weekly  Patient Education  TENS Unit

## 2018-10-28 NOTE — Therapy (Signed)
[facility] [place] [address] [place], [place], [address] Phone: [phone]   Fax:  [phone]  Physical Therapy Evaluation  Patient Details  Name: Ernest Nguyen. MRN: [id number] Date of Birth: [birth year] Referring Provider (PT): Dr Benjamin Stain   Encounter Date: 10/28/2018  PT End of Session - 10/28/18 1611    Visit Number  1    Number of Visits  12    Date for PT Re-Evaluation  12/09/18    PT Start Time  1345    PT Stop Time  1433    PT Time Calculation (min)  48 min    Activity Tolerance  Patient tolerated treatment well       History reviewed. No pertinent past medical history.  History reviewed. No pertinent surgical history.  There were no vitals filed for this visit.   Subjective Assessment - 10/28/18 1401    Subjective  Patient reports that he started having Lt shoulder pain ~ 1 month ago - may be related to lifting weights in the gym. Symtpoms have persisted. Some improvement with time and injection last week.    Pertinent History  Unremarkable    Diagnostic tests  xrays    Patient Stated Goals  get rid of the pain and return to normal function - no repeat problems    Currently in Pain?  No/denies    Pain Score  0-No pain    Pain Location  Shoulder    Pain Orientation  Left    Pain Type  Acute pain    Pain Radiating Towards  shoulder to elbow    Pain Onset  More than a month ago    Pain Frequency  Intermittent    Aggravating Factors   placing hand on the Lt hip - certain movements such as taking shirt off over head - aching at night; sleeping on the Lt side    Pain Relieving Factors  injection         [facility] PT Assessment - 10/28/18 0001      Assessment   Medical Diagnosis  Lt shoulder pain     Referring Provider (PT)  Dr Benjamin Stain    Onset Date/Surgical Date  09/24/18    Hand Dominance  Right    Next MD Visit  11/11/2018 and 12/03/2018    Prior Therapy  Rt knee       Precautions   Precautions  None      Balance Screen   Has the patient fallen in the past 6 months  No    Has the patient had a decrease in activity level because of a fear of falling?   No    Is the patient reluctant to leave their home because of a fear of falling?   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     Leisure  walking 4-5 days/wk; weights 2-3 days/wk - free weights - - - TV       Observation/Other Assessments   Focus on Therapeutic Outcomes (FOTO)   25% limitation       Sensation   Additional Comments  WFL's      Posture/Postural Control   Posture Comments  head forward; shoudlers rounded and elevated; head of the humerus anterior inorientation; scapulae abducted and rotated along  the thoracic wall       AROM   Right Shoulder Flexion  146 Degrees    Right Shoulder ABduction  153 Degrees    Right Shoulder Internal Rotation  --   thumb to T9   Right Shoulder External Rotation  97 Degrees    Left Shoulder Flexion  131 Degrees    Left Shoulder ABduction  121 Degrees   pain   Left Shoulder Internal Rotation  --   T12   Left Shoulder External Rotation  61 Degrees      Strength   Overall Strength Comments  5/5 UE's except middle and lower trap 4+/5 Lt; 5-/5 Rt       Palpation   Spinal mobility  hypomobile mid to upper thoracic spine     Palpation comment  muscular tightness Lt > Rt pecs; upper trap; leveator; teres; anterior deltoid                 Objective measurements completed on examination: See above findings.      [facility] Adult PT Treatment/Exercise - 10/28/18 0001      Neuro Re-ed    Neuro Re-ed Details   working on posture and alignment encouraging engagement of posterior shoulder girdle musculature       Shoulder Exercises: Standing   Other Standing Exercises  axial extension 10 sec x 5; scap squeeze 10 sec x 5;  L's x 10; W's with swim noodle       Shoulder Exercises: Stretch   Other Shoulder Stretches  doorway stretch 3 positions 30 sec x 1 rep                   PT Long Term Goals - 10/28/18 1605      PT LONG TERM GOAL #1   Title  Improve posture and alignment with patient to demonstrate improved upright posture with posterior shoulder girdle engaged    Time  6    Period  Weeks    Status  New    Target Date  12/09/18      PT LONG TERM GOAL #2   Title  Increase AROM Lt shoulder to equal or better than AROM Rt shoulder    Time  6    Period  Weeks    Status  New    Target Date  12/09/18      PT LONG TERM GOAL #3   Title  Decrease pain to 0/10 to no more than 2/10 for all functional activities    Time  6    Period  Weeks    Status  New    Target Date  12/09/18      PT LONG TERM GOAL #4   Title  Independent in HEP    Time  6    Period  Weeks    Status  New    Target Date  12/09/18      PT LONG TERM GOAL #5   Title  Improve FOTO to </= 20% limitation    Time  6    Period  Weeks    Status  New    Target Date  12/09/18             Plan - 10/28/18 1436    Clinical Impression Statement  T presents with Lt shoulder pain present for the past month. He feels he may have irritated shoulder lifting weights at home. Patient sleeps on the Lt side. He has difficulty with reaching behind  his back and discomfort with elevation and does report awakening during the night due to pain. Patient has noticed improvement in pain since injection last week. He has poor posture and alignment through the shoulder girdle; limited ROM; pain with end ranges; muscular tightness to palpation; decreased functional activity. Patient will benefit form PT to address problems identified.    Stability/Clinical Decision Making  Stable/Uncomplicated    Clinical Decision Making  Low    Rehab Potential  Good    PT Frequency  2x / week    PT Duration  6 weeks    PT Treatment/Interventions   Patient/family education;ADLs/Self Care Home Management;Cryotherapy;Electrical Stimulation;Iontophoresis 4mg /ml Dexamethasone;Moist Heat;Ultrasound;Manual techniques;Dry needling;Neuromuscular re-education;Therapeutic activities;Therapeutic exercise    PT Next Visit Plan  review HEP; add pulley; stretch pecs/biceps/anterior chest; manual work through anterior shoulder; strengthening posterior shoulder girdle musculature; modalities as indicated    PT Home Exercise Plan  [id number]    Consulted and Agree with Plan of Care  Patient       Patient will benefit from skilled therapeutic intervention in order to improve the following deficits and impairments:  Pain, Postural dysfunction, Improper body mechanics, Increased fascial restricitons, Increased muscle spasms, Hypomobility, Decreased strength, Decreased range of motion, Decreased mobility, Decreased activity tolerance  Visit Diagnosis: Acute pain of left shoulder - Plan: PT plan of care cert/re-cert  Scapular dyskinesis - Plan: PT plan of care cert/re-cert  Other symptoms and signs involving the musculoskeletal system - Plan: PT plan of care cert/re-cert     Problem List Patient Active Problem List   Diagnosis Date Noted  . Acute shoulder bursitis, left 10/22/2018  . Costochondritis 12/01/2017  . Hyperlipidemia 10/17/2014  . Insomnia 07/01/2013  . Annual physical exam 07/01/2013  . 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 [place], [place], [place] Phone: [phone]   Fax:  [phone]  Name: Abdelrahman Nair. MRN: Suella Broad Date of Birth: [birth year]

## 2018-11-02 ENCOUNTER — Encounter: Payer: Self-pay | Admitting: Physical Therapy

## 2018-11-02 ENCOUNTER — Other Ambulatory Visit: Payer: Self-pay

## 2018-11-02 ENCOUNTER — Ambulatory Visit (INDEPENDENT_AMBULATORY_CARE_PROVIDER_SITE_OTHER): Payer: BC Managed Care – PPO | Admitting: Physical Therapy

## 2018-11-02 DIAGNOSIS — R29898 Other symptoms and signs involving the musculoskeletal system: Secondary | ICD-10-CM

## 2018-11-02 DIAGNOSIS — G2589 Other specified extrapyramidal and movement disorders: Secondary | ICD-10-CM

## 2018-11-02 DIAGNOSIS — M25512 Pain in left shoulder: Secondary | ICD-10-CM

## 2018-11-02 NOTE — Therapy (Signed)
[facility] [place] [address] [place] [address] [place], [place], [address] Phone: [phone]   Fax:  [phone]  Physical Therapy Treatment  Patient Details  Name: Ernest Nguyen. MRN: [id number] Date of Birth: [birth year] Referring Provider (PT): Dr Benjamin Stain   Encounter Date: 11/02/2018  PT End of Session - 11/02/18 1524    Visit Number  2    Number of Visits  12    Date for PT Re-Evaluation  12/09/18    PT Start Time  1520    PT Stop Time  1602    PT Time Calculation (min)  42 min       History reviewed. No pertinent past medical history.  History reviewed. No pertinent surgical history.  There were no vitals filed for this visit.  Subjective Assessment - 11/02/18 1520    Subjective  Pt reports he has done the exercises of HEP.  No real change since last visit.    Currently in Pain?  No/denies   just tight   Pain Score  0-No pain         [facility] PT Assessment - 11/02/18 0001      Assessment   Medical Diagnosis  Lt shoulder pain     Referring Provider (PT)  Dr Benjamin Stain    Onset Date/Surgical Date  09/24/18    Hand Dominance  Right    Next MD Visit  11/11/2018 and 12/03/2018    Prior Therapy  Rt knee        [facility] Adult PT Treatment/Exercise - 11/02/18 0001      Self-Care   Self-Care  Other Self-Care Comments    Other Self-Care Comments   pt educated on self massage with ball to Lt pec and posterior shoulder; pt returned demo with cues.       Exercises   Exercises  Shoulder      Shoulder Exercises: Seated   Other Seated Exercises  thoracic ext over back of chair x 5 reps, holding 5 sec       Shoulder Exercises: Standing   Other Standing Exercises  axial extension 10 sec x 5; scap squeeze 10 sec x 5; L's x 10; W's x 10  with swim noodle       Shoulder Exercises: ROM/Strengthening   UBE (Upper Arm Bike)  L1: each direction       Shoulder Exercises: Stretch   Corner Stretch  20 seconds;2 reps   3  position    Other Shoulder Stretches  doorway stretch 3 positions 30 sec x 1 rep     Other Shoulder Stretches  bicep stretch bilat (holding door) bilat x 30 sec x 2, then repeated with LUE; prolonged stretch with LUE off edge of bed (difficulty tolerating)      Manual Therapy   Manual Therapy  Passive ROM;Soft tissue mobilization    Manual therapy comments  Pt in supine    Soft tissue mobilization  STM to Lt pec, bicep, anterior shoulder    Passive ROM  Lt shoulder scaption, ER, horiz abdct, ext              PT Education - 11/02/18 1659    Education Details  HEP - added thoracic ext over chair; issued sample of biofreeze.    Person(s) Educated  Patient    Methods  Explanation;Handout;Verbal cues    Comprehension  Verbalized understanding;Returned demonstration          PT Long Term Goals - 10/28/18 1605  PT LONG TERM GOAL #1   Title  Improve posture and alignment with patient to demonstrate improved upright posture with posterior shoulder girdle engaged    Time  6    Period  Weeks    Status  New    Target Date  12/09/18      PT LONG TERM GOAL #2   Title  Increase AROM Lt shoulder to equal or better than AROM Rt shoulder    Time  6    Period  Weeks    Status  New    Target Date  12/09/18      PT LONG TERM GOAL #3   Title  Decrease pain to 0/10 to no more than 2/10 for all functional activities    Time  6    Period  Weeks    Status  New    Target Date  12/09/18      PT LONG TERM GOAL #4   Title  Independent in HEP    Time  6    Period  Weeks    Status  New    Target Date  12/09/18      PT LONG TERM GOAL #5   Title  Improve FOTO to </= 20% limitation    Time  6    Period  Weeks    Status  New    Target Date  12/09/18            Plan - 11/02/18 1657    Clinical Impression Statement  Pt tolerated all exercises well, requiring only minor cues for form.  Palpable tightness throughout Lt pec and ant shoulder; improved with manual therapy.  Pt  demonstrated increased ease in propping Lt hand on Lt hip after manual therapy (usually painful and stiff to attain this position).  Pt will benefit from continued PT intervention to maximize functional mobility.    Stability/Clinical Decision Making  Stable/Uncomplicated    Rehab Potential  Good    PT Frequency  2x / week    PT Duration  6 weeks    PT Treatment/Interventions  Patient/family education;ADLs/Self Care Home Management;Cryotherapy;Electrical Stimulation;Iontophoresis 4mg /ml Dexamethasone;Moist Heat;Ultrasound;Manual techniques;Dry needling;Neuromuscular re-education;Therapeutic activities;Therapeutic exercise    PT Next Visit Plan  add pulley; manual work through anterior shoulder; strengthening posterior shoulder girdle musculature; modalities as indicated    PT Home Exercise Plan  [id number]    Consulted and Agree with Plan of Care  Patient       Patient will benefit from skilled therapeutic intervention in order to improve the following deficits and impairments:  Pain, Postural dysfunction, Improper body mechanics, Increased fascial restricitons, Increased muscle spasms, Hypomobility, Decreased strength, Decreased range of motion, Decreased mobility, Decreased activity tolerance  Visit Diagnosis: Acute pain of left shoulder  Scapular dyskinesis  Other symptoms and signs involving the musculoskeletal system     Problem List Patient Active Problem List   Diagnosis Date Noted  . Acute shoulder bursitis, left 10/22/2018  . Costochondritis 12/01/2017  . Hyperlipidemia 10/17/2014  . Insomnia 07/01/2013  . Annual physical exam 07/01/2013  . 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 MRN: [id number] Date of Birth: May 30, 1960

## 2018-11-05 ENCOUNTER — Other Ambulatory Visit: Payer: Self-pay

## 2018-11-05 ENCOUNTER — Encounter: Payer: Self-pay | Admitting: Physical Therapy

## 2018-11-05 ENCOUNTER — Ambulatory Visit (INDEPENDENT_AMBULATORY_CARE_PROVIDER_SITE_OTHER): Payer: BC Managed Care – PPO | Admitting: Physical Therapy

## 2018-11-05 DIAGNOSIS — E785 Hyperlipidemia, unspecified: Secondary | ICD-10-CM | POA: Diagnosis not present

## 2018-11-05 DIAGNOSIS — M25512 Pain in left shoulder: Secondary | ICD-10-CM | POA: Diagnosis not present

## 2018-11-05 DIAGNOSIS — N138 Other obstructive and reflux uropathy: Secondary | ICD-10-CM | POA: Diagnosis not present

## 2018-11-05 DIAGNOSIS — G2589 Other specified extrapyramidal and movement disorders: Secondary | ICD-10-CM | POA: Diagnosis not present

## 2018-11-05 DIAGNOSIS — R29898 Other symptoms and signs involving the musculoskeletal system: Secondary | ICD-10-CM

## 2018-11-05 DIAGNOSIS — N401 Enlarged prostate with lower urinary tract symptoms: Secondary | ICD-10-CM | POA: Diagnosis not present

## 2018-11-05 NOTE — Therapy (Signed)
[facility] [place] [facility] [place], [place], [address] Phone: [phone]   Fax:  [phone]  Physical Therapy Treatment  Patient Details  Name: Ernest Nguyen. MRN: [id number] Date of Birth: 21-Dec-1960 Referring Provider (PT): Dr Dianah Field   Encounter Date: 11/05/2018  PT End of Session - 11/05/18 1521    Visit Number  3    Number of Visits  12    Date for PT Re-Evaluation  12/09/18    PT Start Time  4270    PT Stop Time  1605    PT Time Calculation (min)  42 min    Activity Tolerance  Patient tolerated treatment well    Behavior During Therapy  [facility] for tasks assessed/performed       History reviewed. No pertinent past medical history.  History reviewed. No pertinent surgical history.  There were no vitals filed for this visit.  Subjective Assessment - 11/05/18 1525    Subjective  No change. Still feeling pain only with certain movements.    Patient Stated Goals  get rid of the pain and return to normal function - no repeat problems    Currently in Pain?  No/denies         [facility] PT Assessment - 11/05/18 0001      Strength   Overall Strength Comments  mild pain with resisted ER today, but not with tband                   [facility] Adult PT Treatment/Exercise - 11/05/18 0001      Exercises   Exercises  Shoulder      Shoulder Exercises: Standing   Protraction Limitations  painful at end range    Horizontal ABduction  Both;15 reps;Theraband    Theraband Level (Shoulder Horizontal ABduction)  Level 1 (Yellow)    External Rotation  Left;20 reps;Theraband    Theraband Level (Shoulder External Rotation)  Level 1 (Yellow)    Internal Rotation  Left;20 reps;Theraband    Theraband Level (Shoulder Internal Rotation)  Level 1 (Yellow)    Flexion Limitations  painful at end range    Extension  Left;20 reps;Theraband    Theraband Level (Shoulder Extension)  Level 1 (Yellow)    Row  Both;10  reps;Theraband    Theraband Level (Shoulder Row)  Level 1 (Yellow)    Row Limitations  feels ant shoulder    Diagonals  Left;5 reps    Theraband Level (Shoulder Diagonals)  Level 1 (Yellow)    Diagonals Limitations  feels at end range      Shoulder Exercises: ROM/Strengthening   UBE (Upper Arm Bike)  L2 x 4 min 2 fwd/bwd      Shoulder Exercises: Stretch   Other Shoulder Stretches  doorway stretch 3 positions 30 sec x 1 rep     Other Shoulder Stretches  bicep stretch 1x 30 sec      Manual Therapy   Manual Therapy  Soft tissue mobilization;Myofascial release    Manual therapy comments  Pt in supine    Soft tissue mobilization  to left pectorals and deltoid                  PT Long Term Goals - 10/28/18 1605      PT LONG TERM GOAL #1   Title  Improve posture and alignment with patient to demonstrate improved upright posture with posterior shoulder girdle engaged    Time  6    Period  Weeks  Status  New    Target Date  12/09/18      PT LONG TERM GOAL #2   Title  Increase AROM Lt shoulder to equal or better than AROM Rt shoulder    Time  6    Period  Weeks    Status  New    Target Date  12/09/18      PT LONG TERM GOAL #3   Title  Decrease pain to 0/10 to no more than 2/10 for all functional activities    Time  6    Period  Weeks    Status  New    Target Date  12/09/18      PT LONG TERM GOAL #4   Title  Independent in HEP    Time  6    Period  Weeks    Status  New    Target Date  12/09/18      PT LONG TERM GOAL #5   Title  Improve FOTO to </= 20% limitation    Time  6    Period  Weeks    Status  New    Target Date  12/09/18            Plan - 11/05/18 1611    Clinical Impression Statement  Patient had pain with flexion, protraction and diagonals at end range with yellow tband. Continued tightness in left pec and deltoids. Issued tband for IR, ER and ext. MD appt after next visit.    PT Frequency  2x / week    PT Duration  6 weeks    PT  Treatment/Interventions  Patient/family education;ADLs/Self Care Home Management;Cryotherapy;Electrical Stimulation;Iontophoresis 4mg /ml Dexamethasone;Moist Heat;Ultrasound;Manual techniques;Dry needling;Neuromuscular re-education;Therapeutic activities;Therapeutic exercise    PT Next Visit Plan  MD note; add pulley; manual work through anterior shoulder and deltoid; strengthening posterior shoulder girdle musculature; modalities as indicated    PT Home Exercise Plan  [facility]       Patient will benefit from skilled therapeutic intervention in order to improve the following deficits and impairments:  Pain, Postural dysfunction, Improper body mechanics, Increased fascial restricitons, Increased muscle spasms, Hypomobility, Decreased strength, Decreased range of motion, Decreased mobility, Decreased activity tolerance  Visit Diagnosis: Acute pain of left shoulder  Scapular dyskinesis  Other symptoms and signs involving the musculoskeletal system     Problem List Patient Active Problem List   Diagnosis Date Noted  . Acute shoulder bursitis, left 10/22/2018  . Costochondritis 12/01/2017  . Hyperlipidemia 10/17/2014  . Insomnia 07/01/2013  . Annual physical exam 07/01/2013  . 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 MRN: Ernest Nguyen Date of Birth: 22-Feb-1960

## 2018-11-06 LAB — COMPLETE METABOLIC PANEL WITH GFR
AG Ratio: 1.6 (calc) (ref 1.0–2.5)
ALT: 17 U/L (ref 9–46)
AST: 23 U/L (ref 10–35)
Albumin: 4.6 g/dL (ref 3.6–5.1)
Alkaline phosphatase (APISO): 56 U/L (ref 35–144)
BUN: 13 mg/dL (ref 7–25)
CO2: 30 mmol/L (ref 20–32)
Calcium: 10 mg/dL (ref 8.6–10.3)
Chloride: 101 mmol/L (ref 98–110)
Creat: 1.16 mg/dL (ref 0.70–1.33)
GFR, Est African American: 79 mL/min/{1.73_m2} (ref >=60)
GFR, Est Non African American: 69 mL/min/{1.73_m2} (ref >=60)
Globulin: 2.8 g/dL (calc) (ref 1.9–3.7)
Glucose, Bld: 95 mg/dL (ref 65–99)
Potassium: 4.4 mmol/L (ref 3.5–5.3)
Sodium: 138 mmol/L (ref 135–146)
Total Bilirubin: 0.9 mg/dL (ref 0.2–1.2)
Total Protein: 7.4 g/dL (ref 6.1–8.1)

## 2018-11-06 LAB — CBC
HCT: 44.1 % (ref 38.5–50.0)
Hemoglobin: 14.4 g/dL (ref 13.2–17.1)
MCH: 30.1 pg (ref 27.0–33.0)
MCHC: 32.7 g/dL (ref 32.0–36.0)
MCV: 92.3 fL (ref 80.0–100.0)
MPV: 9.3 fL (ref 7.5–12.5)
Platelets: 240 10*3/uL (ref 140–400)
RBC: 4.78 10*6/uL (ref 4.20–5.80)
RDW: 12 % (ref 11.0–15.0)
WBC: 3.4 10*3/uL — ABNORMAL LOW (ref 3.8–10.8)

## 2018-11-06 LAB — HEMOGLOBIN A1C
Hgb A1c MFr Bld: 5.4 % of total Hgb (ref <5.7)
Mean Plasma Glucose: 108 (calc)
eAG (mmol/L): 6 (calc)

## 2018-11-06 LAB — LIPID PANEL W/REFLEX DIRECT LDL
Cholesterol: 147 mg/dL (ref <200)
HDL: 65 mg/dL (ref >=40)
LDL Cholesterol (Calc): 71 mg/dL (calc)
Non-HDL Cholesterol (Calc): 82 mg/dL (calc) (ref <130)
Total CHOL/HDL Ratio: 2.3 (calc) (ref <5.0)
Triglycerides: 37 mg/dL (ref <150)

## 2018-11-06 LAB — TSH: TSH: 0.78 mIU/L (ref 0.40–4.50)

## 2018-11-06 LAB — PSA, TOTAL AND FREE
PSA, % Free: 20 % (calc) — ABNORMAL LOW (ref >25)
PSA, Free: 0.1 ng/mL
PSA, Total: 0.5 ng/mL (ref <=4.0)

## 2018-11-09 ENCOUNTER — Other Ambulatory Visit: Payer: Self-pay

## 2018-11-09 ENCOUNTER — Ambulatory Visit (INDEPENDENT_AMBULATORY_CARE_PROVIDER_SITE_OTHER): Payer: BC Managed Care – PPO | Admitting: Physical Therapy

## 2018-11-09 DIAGNOSIS — G2589 Other specified extrapyramidal and movement disorders: Secondary | ICD-10-CM

## 2018-11-09 DIAGNOSIS — R29898 Other symptoms and signs involving the musculoskeletal system: Secondary | ICD-10-CM | POA: Diagnosis not present

## 2018-11-09 DIAGNOSIS — M25512 Pain in left shoulder: Secondary | ICD-10-CM

## 2018-11-09 NOTE — Therapy (Signed)
[facility] [address]  [address] [place], [place], [address] Phone: [phone]   Fax:  [phone]  Physical Therapy Treatment  Patient Details  Name: Ernest Nguyen. MRN: [id number] Date of Birth: [birth year] Referring Provider (PT): Dr Benjamin Stain   Encounter Date: 11/09/2018  PT End of Session - 11/09/18 1518    Visit Number  4    Date for PT Re-Evaluation  12/09/18    PT Start Time  1516    PT Stop Time  1602    PT Time Calculation (min)  46 min    Activity Tolerance  Patient tolerated treatment well    Behavior During Therapy  [facility] for tasks assessed/performed       No past medical history on file.  No past surgical history on file.  There were no vitals filed for this visit.  Subjective Assessment - 11/09/18 1518    Subjective  Shoulder about the same    Patient Stated Goals  get rid of the pain and return to normal function - no repeat problems    Currently in Pain?  No/denies                       [facility] Adult PT Treatment/Exercise - 11/09/18 0001      Shoulder Exercises: Prone   Horizontal ABduction 1  Left;10 reps    Horizontal ABduction 2  Left;5 reps    Horizontal ABduction 2 Limitations  painful in pecs      Shoulder Exercises: Standing   Horizontal ABduction  Both;20 reps;Theraband    Theraband Level (Shoulder Horizontal ABduction)  Level 1 (Yellow)    External Rotation  Left;20 reps;Theraband    Theraband Level (Shoulder External Rotation)  Level 1 (Yellow)    Internal Rotation  Left;20 reps;Theraband    Theraband Level (Shoulder Internal Rotation)  Level 2 (Red)    Extension  Left;20 reps;Theraband    Theraband Level (Shoulder Extension)  Level 1 (Yellow)    Row  Both;20 reps;Theraband    Theraband Level (Shoulder Row)  Level 1 (Yellow)    Row Limitations  no pain today      Shoulder Exercises: Pulleys   Flexion  2 minutes    Flexion Limitations  some pain in ant shoulder at  end range    Scaption  2 minutes      Shoulder Exercises: Isometric Strengthening   Flexion  5X10"    ABduction  5X10"    Other Isometric Exercises  protraction 5x10"      Manual Therapy   Manual Therapy  Soft tissue mobilization    Soft tissue mobilization  to left pectorals and deltoids       Trigger Point Dry Needling - 11/09/18 0001    Consent Given?  Yes    Education Handout Provided  Yes    Muscles Treated Upper Quadrant  Pectoralis major;Pectoralis minor;Deltoid    Dry Needling Comments  left    Pectoralis Major Response  Twitch response elicited;Palpable increased muscle length    Pectoralis Minor Response  Twitch response elicited;Palpable increased muscle length    Deltoid Response  Twitch response elicited;Palpable increased muscle length           PT Education - 11/09/18 1603    Education Details  HEP    Person(s) Educated  Patient    Methods  Explanation;Demonstration;Handout    Comprehension  Verbalized understanding;Returned demonstration          PT Long  Term Goals - 10/28/18 1605      PT LONG TERM GOAL #1   Title  Improve posture and alignment with patient to demonstrate improved upright posture with posterior shoulder girdle engaged    Time  6    Period  Weeks    Status  New    Target Date  12/09/18      PT LONG TERM GOAL #2   Title  Increase AROM Lt shoulder to equal or better than AROM Rt shoulder    Time  6    Period  Weeks    Status  New    Target Date  12/09/18      PT LONG TERM GOAL #3   Title  Decrease pain to 0/10 to no more than 2/10 for all functional activities    Time  6    Period  Weeks    Status  New    Target Date  12/09/18      PT LONG TERM GOAL #4   Title  Independent in HEP    Time  6    Period  Weeks    Status  New    Target Date  12/09/18      PT LONG TERM GOAL #5   Title  Improve FOTO to </= 20% limitation    Time  6    Period  Weeks    Status  New    Target Date  12/09/18            Plan -  11/09/18 1603    Clinical Impression Statement  Patient tolerated isometrics in flex/abd/protraction without pain, but continued to have pain in pecs with prone Ys. He responded very well to DN in pectorals and deltoids with significant decrease in tissue tension. He did not need MD note because his appt was for a physical.    PT Treatment/Interventions  Patient/family education;ADLs/Self Care Home Management;Cryotherapy;Electrical Stimulation;Iontophoresis 4mg /ml Dexamethasone;Moist Heat;Ultrasound;Manual techniques;Dry needling;Neuromuscular re-education;Therapeutic activities;Therapeutic exercise    PT Next Visit Plan  Assess DN, manual work through anterior shoulder and deltoid; strengthening posterior shoulder girdle musculature; modalities as indicated    PT Home Exercise Plan  [id number]    Consulted and Agree with Plan of Care  Patient       Patient will benefit from skilled therapeutic intervention in order to improve the following deficits and impairments:  Pain, Postural dysfunction, Improper body mechanics, Increased fascial restricitons, Increased muscle spasms, Hypomobility, Decreased strength, Decreased range of motion, Decreased mobility, Decreased activity tolerance  Visit Diagnosis: Acute pain of left shoulder  Scapular dyskinesis  Other symptoms and signs involving the musculoskeletal system     Problem List Patient Active Problem List   Diagnosis Date Noted  . Acute shoulder bursitis, left 10/22/2018  . Costochondritis 12/01/2017  . Hyperlipidemia 10/17/2014  . Insomnia 07/01/2013  . Annual physical exam 07/01/2013  . 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 MRN: [id number] Date of Birth: Jun 08, 1960

## 2018-11-09 NOTE — Patient Instructions (Signed)
Trigger Point Dry Needling  . What is Trigger Point Dry Needling (DN)? o DN is a physical therapy technique used to treat muscle pain and dysfunction. Specifically, DN helps deactivate muscle trigger points (muscle knots).  o A thin filiform needle is used to penetrate the skin and stimulate the underlying trigger point. The goal is for a local twitch response (LTR) to occur and for the trigger point to relax. No medication of any kind is injected during the procedure.   . What Does Trigger Point Dry Needling Feel Like?  o The procedure feels different for each individual patient. Some patients report that they do not actually feel the needle enter the skin and overall the process is not painful. Very mild bleeding may occur. However, many patients feel a deep cramping in the muscle in which the needle was inserted. This is the local twitch response.   Marland Kitchen How Will I feel after the treatment? o Soreness is normal, and the onset of soreness may not occur for a few hours. Typically this soreness does not last longer than two days.  o Bruising is uncommon, however; ice can be used to decrease any possible bruising.  o In rare cases feeling tired or nauseous after the treatment is normal. In addition, your symptoms may get worse before they get better, this period will typically not last longer than 24 hours.   . What Can I do After My Treatment? o Increase your hydration by drinking more water for the next 24 hours. o You may place ice or heat on the areas treated that have become sore, however, do not use heat on inflamed or bruised areas. Heat often brings more relief post needling. o You can continue your regular activities, but vigorous activity is not recommended initially after the treatment for 24 hours. o DN is best combined with other physical therapy such as strengthening, stretching, and other therapies.    Madelyn Flavors, PT 11/09/18 4:03 PM;  [facility] [place] [place], [place] [address]  [phone] (office) [phone] (fax)

## 2018-11-11 ENCOUNTER — Encounter: Payer: Self-pay | Admitting: Sports Medicine

## 2018-11-11 ENCOUNTER — Ambulatory Visit (INDEPENDENT_AMBULATORY_CARE_PROVIDER_SITE_OTHER): Payer: BC Managed Care – PPO | Admitting: Sports Medicine

## 2018-11-11 ENCOUNTER — Encounter: Payer: BC Managed Care – PPO | Admitting: Physical Therapy

## 2018-11-11 ENCOUNTER — Other Ambulatory Visit: Payer: Self-pay

## 2018-11-11 VITALS — BP 112/76 | HR 73 | Ht 72.0 in | Wt 199.0 lb

## 2018-11-11 DIAGNOSIS — Z23 Encounter for immunization: Secondary | ICD-10-CM

## 2018-11-11 DIAGNOSIS — M7552 Bursitis of left shoulder: Secondary | ICD-10-CM | POA: Diagnosis not present

## 2018-11-11 DIAGNOSIS — G4700 Insomnia, unspecified: Secondary | ICD-10-CM

## 2018-11-11 DIAGNOSIS — Z0000 Encounter for general adult medical examination without abnormal findings: Secondary | ICD-10-CM

## 2018-11-11 MED ORDER — ZOLPIDEM TARTRATE 10 MG PO TABS: 10.0000 mg | ORAL_TABLET | Freq: Every evening | ORAL | 3 refills | Status: DC | PRN

## 2018-11-11 NOTE — Assessment & Plan Note (Addendum)
Routine physical as above.  Labs look good. I have recommended the Shingrix vaccine, he desires to look into this first.

## 2018-11-11 NOTE — Progress Notes (Signed)
Subjective:    CC: CPE  HPI:  This is a pleasant 58 year old male here for his physical.  I reviewed the past medical history, family history, social history, surgical history, and allergies today and no changes were needed.  Please see the problem list section below in epic for further details.  Past Medical History: No past medical history on file. Past Surgical History: No past surgical history on file. Social History: Social History   Socioeconomic History  . Marital status: Married    Spouse name: Not on file  . Number of children: Not on file  . Years of education: Not on file  . Highest education level: Not on file  Occupational History  . Not on file  Social Needs  . Financial resource strain: Not on file  . Food insecurity    Worry: Not on file    Inability: Not on file  . Transportation needs    Medical: Not on file    Non-medical: Not on file  Tobacco Use  . Smoking status: Never Smoker  . Smokeless tobacco: Never Used  Substance and Sexual Activity  . Alcohol use: Yes    Comment: daily  . Drug use: No  . Sexual activity: Not on file  Lifestyle  . Physical activity    Days per week: Not on file    Minutes per session: Not on file  . Stress: Not on file  Relationships  . Social Musician on phone: Not on file    Gets together: Not on file    Attends religious service: Not on file    Active member of club or organization: Not on file    Attends meetings of clubs or organizations: Not on file    Relationship status: Not on file  Other Topics Concern  . Not on file  Social History Narrative  . Not on file   Family History: Family History  Problem Relation Age of Onset  . Stroke Mother   . Alcohol abuse Father   . Seizures Father    Allergies: No Known Allergies Medications: See med rec.  Review of Systems: No headache, visual changes, nausea, vomiting, diarrhea, constipation, dizziness, abdominal pain, skin rash, fevers, chills,  night sweats, swollen lymph nodes, weight loss, chest pain, body aches, joint swelling, muscle aches, shortness of breath, mood changes, visual or auditory hallucinations.  Objective:    General: Well Developed, well nourished, and in no acute distress.  Neuro: Alert and oriented x3, extra-ocular muscles intact, sensation grossly intact. Cranial nerves II through XII are intact, motor, sensory, and coordinative functions are all intact. HEENT: Normocephalic, atraumatic, pupils equal round reactive to light, neck supple, no masses, no lymphadenopathy, thyroid nonpalpable. Oropharynx, nasopharynx, external ear canals are unremarkable. Skin: Warm and dry, no rashes noted.  Cardiac: Regular rate and rhythm, no murmurs rubs or gallops.  Respiratory: Clear to auscultation bilaterally. Not using accessory muscles, speaking in full sentences.  Abdominal: Soft, nontender, nondistended, positive bowel sounds, no masses, no organomegaly.  Musculoskeletal: Shoulder, elbow, wrist, hip, knee, ankle stable, and with full range of motion.  Impression and Recommendations:    The patient was counselled, risk factors were discussed, anticipatory guidance given.  Annual physical exam Routine physical as above.  Labs look good. I have recommended the Shingrix vaccine, he desires to look into this first.  Acute shoulder bursitis, left Improvement, but only 3 weeks of therapy, he will continue this for at least another month. He did have  a partial improvement from the subacromial injection. If no better in 1 month we will proceed with MRI.   ___________________________________________ Gwen Her. Dianah Field, M.D., ABFM., CAQSM. Primary Care and Sports Medicine [facility]  Adjunct Professor of [employer]

## 2018-11-11 NOTE — Assessment & Plan Note (Signed)
Improvement, but only 3 weeks of therapy, he will continue this for at least another month. He did have a partial improvement from the subacromial injection. If no better in 1 month we will proceed with MRI.

## 2018-11-11 NOTE — Patient Instructions (Signed)
Zoster Vaccine, Recombinant injection What is this medicine? ZOSTER VACCINE (ZOS ter vak SEEN) is used to prevent shingles in adults 59 years old and over. This vaccine is not used to treat shingles or nerve pain from shingles. This medicine may be used for other purposes; ask your health care provider or pharmacist if you have questions. COMMON BRAND NAME(S): SHINGRIX What should I tell my health care provider before I take this medicine? They need to know if you have any of these conditions:  blood disorders or disease  cancer like leukemia or lymphoma  immune system problems or therapy  an unusual or allergic reaction to vaccines, other medications, foods, dyes, or preservatives  pregnant or trying to get pregnant  breast-feeding How should I use this medicine? This vaccine is for injection in a muscle. It is given by a health care professional. Talk to your pediatrician regarding the use of this medicine in children. This medicine is not approved for use in children. Overdosage: If you think you have taken too much of this medicine contact a poison control center or emergency room at once. NOTE: This medicine is only for you. Do not share this medicine with others. What if I miss a dose? Keep appointments for follow-up (booster) doses as directed. It is important not to miss your dose. Call your doctor or health care professional if you are unable to keep an appointment. What may interact with this medicine?  medicines that suppress your immune system  medicines to treat cancer  steroid medicines like prednisone or cortisone This list may not describe all possible interactions. Give your health care provider a list of all the medicines, herbs, non-prescription drugs, or dietary supplements you use. Also tell them if you smoke, drink alcohol, or use illegal drugs. Some items may interact with your medicine. What should I watch for while using this medicine? Visit your doctor for  regular check ups. This vaccine, like all vaccines, may not fully protect everyone. What side effects may I notice from receiving this medicine? Side effects that you should report to your doctor or health care professional as soon as possible:  allergic reactions like skin rash, itching or hives, swelling of the face, lips, or tongue  breathing problems Side effects that usually do not require medical attention (report these to your doctor or health care professional if they continue or are bothersome):  chills  headache  fever  nausea, vomiting  redness, warmth, pain, swelling or itching at site where injected  tiredness This list may not describe all possible side effects. Call your doctor for medical advice about side effects. You may report side effects to FDA at 1-800-FDA-1088. Where should I keep my medicine? This vaccine is only given in a clinic, pharmacy, doctor's office, or other health care setting and will not be stored at home. NOTE: This sheet is a summary. It may not cover all possible information. If you have questions about this medicine, talk to your doctor, pharmacist, or health care provider.  2020 Elsevier/Gold Standard (2016-09-02 13:20:30)  

## 2018-11-12 ENCOUNTER — Encounter: Payer: Self-pay | Admitting: Physical Therapy

## 2018-11-12 ENCOUNTER — Ambulatory Visit (INDEPENDENT_AMBULATORY_CARE_PROVIDER_SITE_OTHER): Payer: BC Managed Care – PPO | Admitting: Physical Therapy

## 2018-11-12 DIAGNOSIS — M25512 Pain in left shoulder: Secondary | ICD-10-CM | POA: Diagnosis not present

## 2018-11-12 DIAGNOSIS — G2589 Other specified extrapyramidal and movement disorders: Secondary | ICD-10-CM

## 2018-11-12 DIAGNOSIS — R29898 Other symptoms and signs involving the musculoskeletal system: Secondary | ICD-10-CM | POA: Diagnosis not present

## 2018-11-12 NOTE — Therapy (Signed)
[place], [place], [address] Phone: [phone]   Fax:  [phone]  Physical Therapy Treatment  Patient Details  Name: Ernest Nguyen. MRN: [id number] Date of Birth: [birth year] Referring Provider (PT): Dr Dianah Field   Encounter Date: 11/12/2018  PT End of Session - 11/12/18 0852    Visit Number  5    Number of Visits  12    Date for PT Re-Evaluation  12/09/18    PT Start Time  0848    PT Stop Time  0927    PT Time Calculation (min)  39 min    Activity Tolerance  Patient tolerated treatment well    Behavior During Therapy  [facility] for tasks assessed/performed       History reviewed. No pertinent past medical history.  History reviewed. No pertinent surgical history.  There were no vitals filed for this visit.  Subjective Assessment - 11/12/18 1040    Subjective  it keeps getting better    Patient Stated Goals  get rid of the pain and return to normal function - no repeat problems    Currently in Pain?  No/denies                       [facility] Adult PT Treatment/Exercise - 11/12/18 0001      Shoulder Exercises: Supine   Protraction  Left;20 reps    Protraction Weight (lbs)  4    External Rotation  20 reps;Weights    External Rotation Weight (lbs)  1      Shoulder Exercises: Sidelying   External Rotation  Left;20 reps;Weights    External Rotation Weight (lbs)  2      Shoulder Exercises: Standing   Horizontal ABduction  Both;20 reps;Theraband    Theraband Level (Shoulder Horizontal ABduction)  Level 2 (Red)    Internal Rotation  Left;20 reps;Theraband    Theraband Level (Shoulder Internal Rotation)  Level 2 (Red)    Extension  Left;20 reps;Theraband    Theraband Level (Shoulder Extension)  Level 2 (Red)    Row  Both;20 reps;Theraband    Theraband Level (Shoulder Row)  Level 2 (Red)    Diagonals  Left;20 reps;Theraband    Theraband Level (Shoulder Diagonals)  Level  1 (Yellow)    Diagonals Limitations  D2 extension      Shoulder Exercises: Pulleys   Flexion  1 minute    Scaption  1 minute      Manual Therapy   Manual Therapy  Soft tissue mobilization;Myofascial release;Passive ROM    Soft tissue mobilization  to left pectorals    Myofascial Release  to left pectorals    Passive ROM  left shoulder flex, IR, ER                  PT Long Term Goals - 11/12/18 1037      PT LONG TERM GOAL #1   Title  Improve posture and alignment with patient to demonstrate improved upright posture with posterior shoulder girdle engaged    Status  On-going      PT LONG TERM GOAL #2   Title  Increase AROM Lt shoulder to equal or better than AROM Rt shoulder    Status  On-going      PT LONG TERM GOAL #3   Title  Decrease pain to 0/10 to no more than 2/10 for all functional activities    Status  Partially Met  PT LONG TERM GOAL #4   Title  Independent in HEP    Status  Partially Met            Plan - 11/12/18 1035    Clinical Impression Statement  Patient progressing well with strengthening. Still feeling twinges in ant left shoulder with Y position. Decreased tissue tension in left pecs but still present. May benefit from further DN. Focus on ROM.    PT Treatment/Interventions  Patient/family education;ADLs/Self Care Home Management;Cryotherapy;Electrical Stimulation;Iontophoresis 50m/ml Dexamethasone;Moist Heat;Ultrasound;Manual techniques;Dry needling;Neuromuscular re-education;Therapeutic activities;Therapeutic exercise    PT Next Visit Plan  ROM,  manual work through anterior shoulder and deltoid; strengthening posterior shoulder girdle musculature; modalities as indicated       Patient will benefit from skilled therapeutic intervention in order to improve the following deficits and impairments:  Pain, Postural dysfunction, Improper body mechanics, Increased fascial restricitons, Increased muscle spasms, Hypomobility, Decreased strength,  Decreased range of motion, Decreased mobility, Decreased activity tolerance  Visit Diagnosis: Scapular dyskinesis  Acute pain of left shoulder  Other symptoms and signs involving the musculoskeletal system     Problem List Patient Active Problem List   Diagnosis Date Noted  . Acute shoulder bursitis, left 10/22/2018  . Costochondritis 12/01/2017  . Hyperlipidemia 10/17/2014  . Insomnia 07/01/2013  . Annual physical exam 07/01/2013  . 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

## 2018-11-19 ENCOUNTER — Ambulatory Visit (INDEPENDENT_AMBULATORY_CARE_PROVIDER_SITE_OTHER): Payer: BC Managed Care – PPO | Admitting: Physical Therapy

## 2018-11-19 ENCOUNTER — Encounter: Payer: Self-pay | Admitting: Physical Therapy

## 2018-11-19 ENCOUNTER — Other Ambulatory Visit: Payer: Self-pay

## 2018-11-19 DIAGNOSIS — R29898 Other symptoms and signs involving the musculoskeletal system: Secondary | ICD-10-CM

## 2018-11-19 DIAGNOSIS — G2589 Other specified extrapyramidal and movement disorders: Secondary | ICD-10-CM | POA: Diagnosis not present

## 2018-11-19 NOTE — Therapy (Signed)
[name] [facility] [name], [place], [address] Phone: [phone]   Fax:  [phone]  Physical Therapy Treatment  Patient Details  Name: Ernest Nguyen. MRN: [id number] Date of Birth: 1960/11/13 Referring Provider (PT): Dr Dianah Field   Encounter Date: 11/19/2018  PT End of Session - 11/19/18 0800    Visit Number  6    Number of Visits  12    Date for PT Re-Evaluation  12/09/18    PT Start Time  0800    PT Stop Time  0846    PT Time Calculation (min)  46 min    Activity Tolerance  Patient tolerated treatment well    Behavior During Therapy  [facility] for tasks assessed/performed       History reviewed. No pertinent past medical history.  History reviewed. No pertinent surgical history.  There were no vitals filed for this visit.  Subjective Assessment - 11/19/18 0800    Subjective  No pain today and very little yesterday.    Patient Stated Goals  get rid of the pain and return to normal function - no repeat problems    Currently in Pain?  No/denies         [facility] PT Assessment - 11/19/18 0001      AROM   Left Shoulder Flexion  135 Degrees   147 prom   Left Shoulder ABduction  123 Degrees    Left Shoulder External Rotation  63 Degrees                   [facility] Adult PT Treatment/Exercise - 11/19/18 0001      Shoulder Exercises: Standing   Other Standing Exercises  MFR with ball      Shoulder Exercises: Pulleys   Flexion  2 minutes    Flexion Limitations  stretch at end range    Scaption  2 minutes    Scaption Limitations  stretch at end range      Shoulder Exercises: ROM/Strengthening   Lat Pull  2 plate    Lat Pull Limitations  3x10     Pec Fly Limitations  3 plates feels strain in left lat    Cybex Row  2 plate;3 plate;20 reps;10 reps    Cybex Row Limitations  use 3 plate    Wall Pushups  20 reps    Wall Pushups Limitations  10 military; 10 regular    Other ROM/Strengthening  Exercises  cane x 10 supine      Shoulder Exercises: Stretch   Internal Rotation Stretch  5 reps    Internal Rotation Stretch Limitations  for HEP; repetitive    Other Shoulder Stretches  lat stretch from door frame 2x30 sec    Other Shoulder Stretches  thoracic ext over noodle; only feels in pecs      Manual Therapy   Manual Therapy  Joint mobilization;Scapular mobilization;Soft tissue mobilization    Joint Mobilization  mid thoracic PA mobs gd III    Soft tissue mobilization  to left rhomboids    Scapular Mobilization  in [facility] for protraction    Passive ROM  left shoulder flex, IR, ER                  PT Long Term Goals - 11/19/18 0854      PT LONG TERM GOAL #1   Title  Improve posture and alignment with patient to demonstrate improved upright posture with posterior shoulder girdle engaged  Baseline  still demos rounded shoulders    Status  On-going      PT LONG TERM GOAL #2   Title  Increase AROM Lt shoulder to equal or better than AROM Rt shoulder    Status  On-going      PT LONG TERM GOAL #3   Title  Decrease pain to 0/10 to no more than 2/10 for all functional activities    Status  Partially Met      PT LONG TERM GOAL #4   Title  Independent in HEP    Status  Partially Met            Plan - 11/19/18 0852    Clinical Impression Statement  Pt progressing with pain control, but still very limited with ROM. He is very stiff in mid thoracic spine  and tight in left rhomboids and subscap. May benefit from DN to these areas.    PT Treatment/Interventions  Patient/family education;ADLs/Self Care Home Management;Cryotherapy;Electrical Stimulation;Iontophoresis 73m/ml Dexamethasone;Moist Heat;Ultrasound;Manual techniques;Dry needling;Neuromuscular re-education;Therapeutic activities;Therapeutic exercise    PT Next Visit Plan  focus on ROM, DN to t-spine, rhomboids, [facility]scap [facility]   PT Home Exercise Plan  [id number]    Consulted and Agree with Plan of Care   Patient       Patient will benefit from skilled therapeutic intervention in order to improve the following deficits and impairments:  Pain, Postural dysfunction, Improper body mechanics, Increased fascial restricitons, Increased muscle spasms, Hypomobility, Decreased strength, Decreased range of motion, Decreased mobility, Decreased activity tolerance  Visit Diagnosis: Scapular dyskinesis  Other symptoms and signs involving the musculoskeletal system     Problem List Patient Active Problem List   Diagnosis Date Noted  . Acute shoulder bursitis, left 10/22/2018  . Costochondritis 12/01/2017  . Hyperlipidemia 10/17/2014  . Insomnia 07/01/2013  . Annual physical exam 07/01/2013  . 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 [place] [address]Phone: [phone]  Fax:  [phone] Name: Ernest Nguyen MRN: [id number] of Birth: 9October 02, 1962

## 2018-11-26 ENCOUNTER — Encounter: Payer: Self-pay | Admitting: Physical Therapy

## 2018-11-26 ENCOUNTER — Ambulatory Visit (INDEPENDENT_AMBULATORY_CARE_PROVIDER_SITE_OTHER): Payer: BC Managed Care – PPO | Admitting: Physical Therapy

## 2018-11-26 ENCOUNTER — Other Ambulatory Visit: Payer: Self-pay

## 2018-11-26 DIAGNOSIS — G2589 Other specified extrapyramidal and movement disorders: Secondary | ICD-10-CM | POA: Diagnosis not present

## 2018-11-26 DIAGNOSIS — R29898 Other symptoms and signs involving the musculoskeletal system: Secondary | ICD-10-CM | POA: Diagnosis not present

## 2018-11-26 DIAGNOSIS — M25512 Pain in left shoulder: Secondary | ICD-10-CM

## 2018-11-26 NOTE — Therapy (Addendum)
Ernest Nguyen, [place], [address] Phone: [phone]   Fax:  [phone]  Physical Therapy Treatment  Patient Details  Name: Ernest Nguyen. MRN: [id number] Date of Birth: December 06, 1960 Referring Provider (PT): Dr Dianah Field   Encounter Date: 11/26/2018  PT End of Session - 11/26/18 1100    Visit Number  7    Number of Visits  12    Date for PT Re-Evaluation  12/09/18    PT Start Time  1100    PT Stop Time  1142    PT Time Calculation (min)  42 min    Activity Tolerance  Patient tolerated treatment well    Behavior During Therapy  Ernest Nguyen for tasks assessed/performed       History reviewed. No pertinent past medical history.  History reviewed. No pertinent surgical history.  There were no vitals filed for this visit.  Subjective Assessment - 11/26/18 1100    Subjective  Patient reports he only feels his shoulder when sleeping and only intermittenly.    Patient Stated Goals  get rid of the pain and return to normal function - no repeat problems    Currently in Pain?  No/denies         Ernest Nguyen PT Assessment - 11/26/18 0001      Assessment   Next MD Visit  12/15/18      AROM   Left Shoulder Flexion  140 Degrees    Left Shoulder ABduction  120 Degrees   165 scaption   Left Shoulder External Rotation  65 Degrees                   [facility] Adult PT Treatment/Exercise - 11/26/18 0001      Shoulder Exercises: Pulleys   Flexion  2 minutes    Flexion Limitations  stretch at end range    Scaption  2 minutes    Scaption Limitations  stretch at end range      Shoulder Exercises: ROM/Strengthening   Lat Pull  3 plate    Lat Pull Limitations  3x10     Pec Fly Limitations  feels pull in lats    Cybex Press  2 plate;10 reps;20 reps    Cybex Press Limitations  horiz and vert holds     Cybex Row  2 plate;3 plate;20 reps;10 reps    Cybex Row Limitations  use 3 plate      Shoulder  Exercises: Stretch   Internal Rotation Stretch  30 seconds    Other Shoulder Stretches  lat stretch from door frame 3x30 sec      Manual Therapy   Manual Therapy  Joint mobilization;Myofascial release;Soft tissue mobilization    Joint Mobilization  mid thoracic PA mobs gd III    Soft tissue mobilization  to left rhomboids and paraspinals.    Myofascial Release  to left thoracic paraspinals       Trigger Point Dry Needling - 11/26/18 0001    Consent Given?  Yes    Muscles Treated Upper Quadrant  Rhomboids    Rhomboids Response  Twitch response elicited;Palpable increased muscle length                PT Long Term Goals - 11/26/18 1102      PT LONG TERM GOAL #1   Title  Improve posture and alignment with patient to demonstrate improved upright posture with posterior shoulder girdle engaged    Status  On-going  PT LONG TERM GOAL #2   Title  Increase AROM Lt shoulder to equal or better than AROM Rt shoulder      PT LONG TERM GOAL #3   Title  Decrease pain to 0/10 to no more than 2/10 for all functional activities    Status  Achieved      PT LONG TERM GOAL #4   Title  Independent in HEP    Status  Achieved            Plan - 11/26/18 1252    Clinical Impression Statement  Good response to DN in left rhomboids today. Still very stiff in thoracic spine but tolerated mobs well. Goals are ongoing.    PT Frequency  2x / week    PT Duration  6 weeks    PT Treatment/Interventions  Patient/family education;ADLs/Self Care Home Management;Cryotherapy;Electrical Stimulation;Iontophoresis 70m/ml Dexamethasone;Moist Heat;Ultrasound;Manual techniques;Dry needling;Neuromuscular re-education;Therapeutic activities;Therapeutic exercise    PT Next Visit Plan  Assess DN; focus on ROM, DN to t-spine, rhomboids, [facility]scap [facility]   PT [facility] Plan  [facility]      Patient will benefit from skilled therapeutic intervention in order to improve the following deficits and  impairments:  Pain, Postural dysfunction, Improper body mechanics, Increased fascial restricitons, Increased muscle spasms, Hypomobility, Decreased strength, Decreased range of motion, Decreased mobility, Decreased activity tolerance  Visit Diagnosis: Scapular dyskinesis  Other symptoms and signs involving the musculoskeletal system  Acute pain of left shoulder     Problem List Patient Active Problem List   Diagnosis Date Noted  . Acute shoulder bursitis, left 10/22/2018  . Costochondritis 12/01/2017  . Hyperlipidemia 10/17/2014  . Insomnia 07/01/2013  . Annual physical exam 07/01/2013  . 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  Patient goals were partially met. Patient is being discharged due to not returning since the last visit.  ?????     Ernest Nguyen PT 02/10/19 10:41 AM  [facility] [address] [phone](office) [phone](fax)

## 2018-12-03 ENCOUNTER — Ambulatory Visit: Payer: BC Managed Care – PPO | Admitting: Sports Medicine

## 2018-12-15 ENCOUNTER — Other Ambulatory Visit: Payer: Self-pay

## 2018-12-15 ENCOUNTER — Encounter: Payer: Self-pay | Admitting: Sports Medicine

## 2018-12-15 ENCOUNTER — Ambulatory Visit (INDEPENDENT_AMBULATORY_CARE_PROVIDER_SITE_OTHER): Payer: BC Managed Care – PPO | Admitting: Sports Medicine

## 2018-12-15 DIAGNOSIS — G8929 Other chronic pain: Secondary | ICD-10-CM | POA: Diagnosis not present

## 2018-12-15 DIAGNOSIS — M25512 Pain in left shoulder: Secondary | ICD-10-CM

## 2018-12-15 DIAGNOSIS — M7552 Bursitis of left shoulder: Secondary | ICD-10-CM | POA: Diagnosis not present

## 2018-12-15 NOTE — Progress Notes (Signed)
  Subjective:    CC: Follow-up  HPI: This is a pleasant 58 year old male, we have been treating him for left shoulder pain for over 2 months now, he has had physical therapy, subacromial injections, continues to have discomfort.  I reviewed the past medical history, family history, social history, surgical history, and allergies today and no changes were needed.  Please see the problem list section below in epic for further details.  Past Medical History: No past medical history on file. Past Surgical History: No past surgical history on file. Social History: Social History   Socioeconomic History  . Marital status: Married    Spouse name: Not on file  . Number of children: Not on file  . Years of education: Not on file  . Highest education level: Not on file  Occupational History  . Not on file  Social Needs  . Financial resource strain: Not on file  . Food insecurity    Worry: Not on file    Inability: Not on file  . Transportation needs    Medical: Not on file    Non-medical: Not on file  Tobacco Use  . Smoking status: Never Smoker  . Smokeless tobacco: Never Used  Substance and Sexual Activity  . Alcohol use: Yes    Comment: daily  . Drug use: No  . Sexual activity: Not on file  Lifestyle  . Physical activity    Days per week: Not on file    Minutes per session: Not on file  . Stress: Not on file  Relationships  . Social Herbalist on phone: Not on file    Gets together: Not on file    Attends religious service: Not on file    Active member of club or organization: Not on file    Attends meetings of clubs or organizations: Not on file    Relationship status: Not on file  Other Topics Concern  . Not on file  Social History Narrative  . Not on file   Family History: Family History  Problem Relation Age of Onset  . Stroke Mother   . Alcohol abuse Father   . Seizures Father    Allergies: No Known Allergies Medications: See med  rec.  Review of Systems: No fevers, chills, night sweats, weight loss, chest pain, or shortness of breath.   Objective:    General: Well Developed, well nourished, and in no acute distress.  Neuro: Alert and oriented x3, extra-ocular muscles intact, sensation grossly intact.  HEENT: Normocephalic, atraumatic, pupils equal round reactive to light, neck supple, no masses, no lymphadenopathy, thyroid nonpalpable.  Skin: Warm and dry, no rashes. Cardiac: Regular rate and rhythm, no murmurs rubs or gallops, no lower extremity edema.  Respiratory: Clear to auscultation bilaterally. Not using accessory muscles, speaking in full sentences.  Impression and Recommendations:    Acute shoulder bursitis, left Bursitis/impingement syndrome, left shoulder, persistent discomfort in spite of subacromial injection, aggressive formal physical therapy for almost 2 months now. Proceeding with MRI without contrast. I do anticipate he will need a subacromial decompression with Dr. Griffin Basil.   ___________________________________________ Gwen Her. Dianah Field, M.D., ABFM., CAQSM. Primary Care and Sports Medicine [facility]  Adjunct Professor of [employer]

## 2018-12-15 NOTE — Assessment & Plan Note (Signed)
Bursitis/impingement syndrome, left shoulder, persistent discomfort in spite of subacromial injection, aggressive formal physical therapy for almost 2 months now. Proceeding with MRI without contrast. I do anticipate he will need a subacromial decompression with Dr. Griffin Basil.

## 2018-12-27 ENCOUNTER — Other Ambulatory Visit: Payer: Self-pay

## 2018-12-27 ENCOUNTER — Ambulatory Visit (INDEPENDENT_AMBULATORY_CARE_PROVIDER_SITE_OTHER): Payer: BC Managed Care – PPO

## 2018-12-27 DIAGNOSIS — G8929 Other chronic pain: Secondary | ICD-10-CM

## 2018-12-27 DIAGNOSIS — M25512 Pain in left shoulder: Secondary | ICD-10-CM

## 2018-12-27 DIAGNOSIS — S46012A Strain of muscle(s) and tendon(s) of the rotator cuff of left shoulder, initial encounter: Secondary | ICD-10-CM | POA: Diagnosis not present

## 2018-12-27 DIAGNOSIS — M7552 Bursitis of left shoulder: Secondary | ICD-10-CM | POA: Diagnosis not present

## 2018-12-27 DIAGNOSIS — M75112 Incomplete rotator cuff tear or rupture of left shoulder, not specified as traumatic: Secondary | ICD-10-CM | POA: Diagnosis not present

## 2018-12-27 IMAGING — MR MR SHOULDER*L* W/O CM
5 series · 40 of 40 positions shown · non-contrast
Comparison: [DATE] x-ray

CLINICAL DATA: Left shoulder pain for 3 months. No known injury

EXAM:
MRI OF THE LEFT SHOULDER WITHOUT CONTRAST
TECHNIQUE: Multiplanar, multisequence MR imaging of the shoulder was performed.
No intravenous contrast was administered.

[Series 6: PD fat-sat · axial · 4.0mm · 0.59mm/px · z∈[-56,+44]mm · 8 of 24 slices shown (1 of 2)]
[im 1/24]
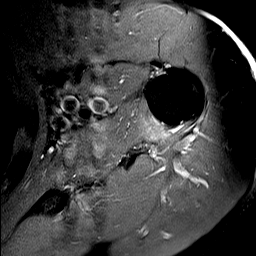
[im 4/24]
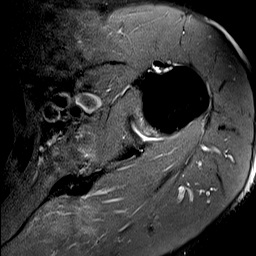
[im 7/24]
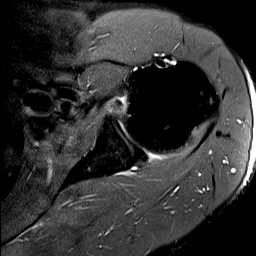
[im 10/24]
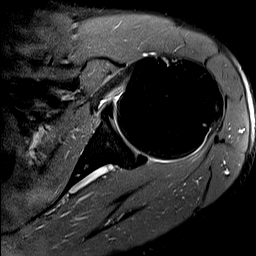
[im 14/24]
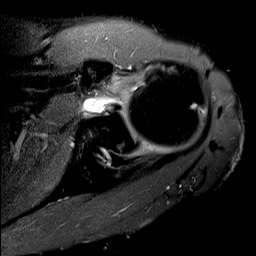
[im 17/24]
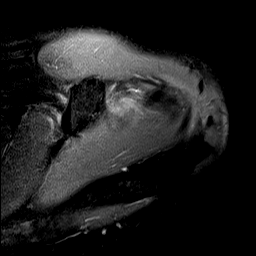
[im 20/24]
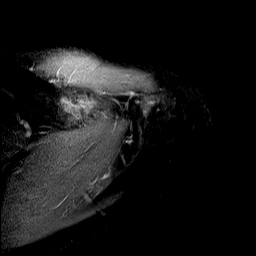
[im 24/24]
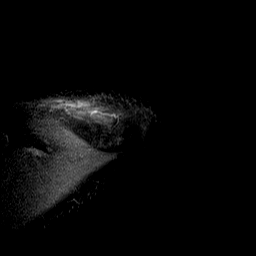

[Series 7: T2 fat-sat · oblique · 4.0mm · 0.59mm/px · 7 of 19 slices shown (1 of 2)]
[im 1/19]
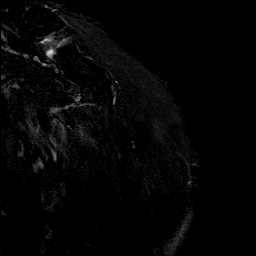
[im 4/19]
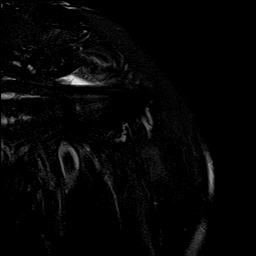
[im 7/19]
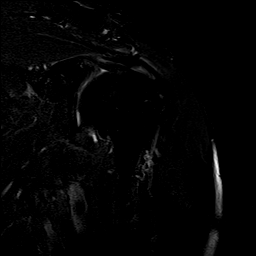
[im 10/19]
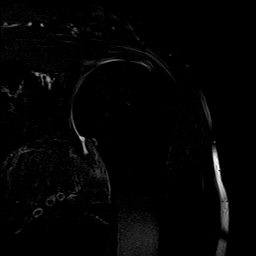
[im 13/19]
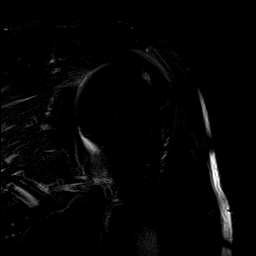
[im 16/19]
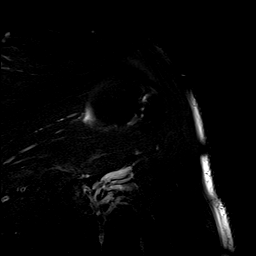
[im 19/19]
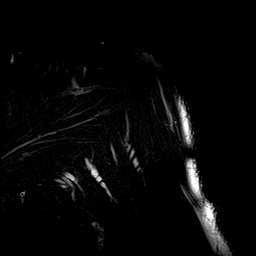

[Series 8: PD fat-sat · oblique · 4.0mm · 0.59mm/px · 7 of 19 slices shown (2 of 2)]
[im 1/19]
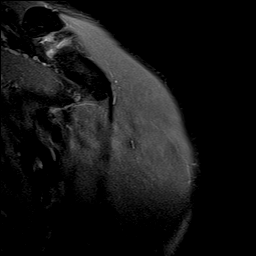
[im 4/19]
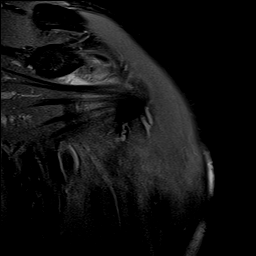
[im 7/19]
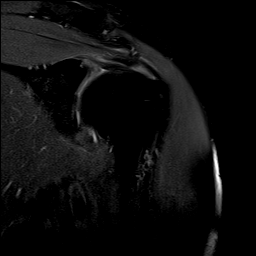
[im 10/19]
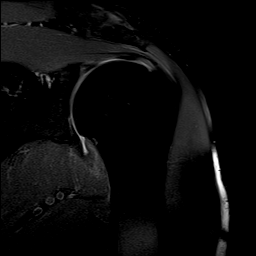
[im 13/19]
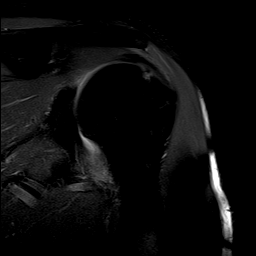
[im 16/19]
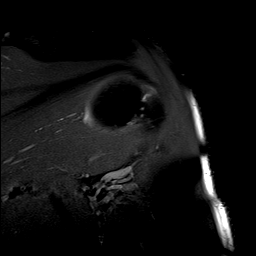
[im 19/19]
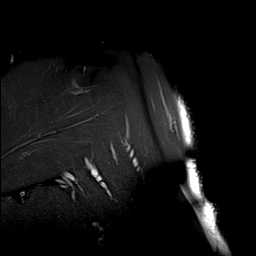

[Series 9: T1 · oblique · 4.0mm · 0.59mm/px · 9 of 24 slices shown]
[im 1/24]
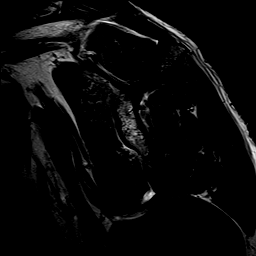
[im 3/24]
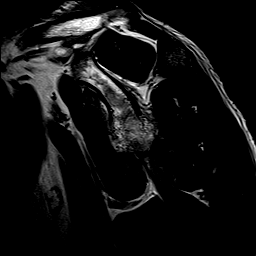
[im 6/24]
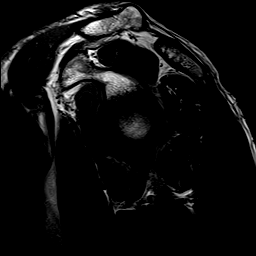
[im 9/24]
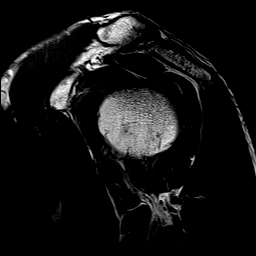
[im 12/24]
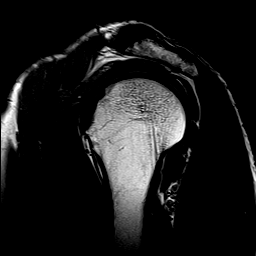
[im 15/24]
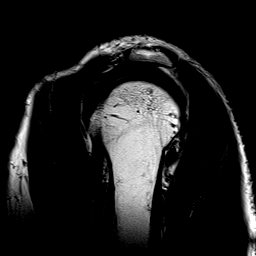
[im 18/24]
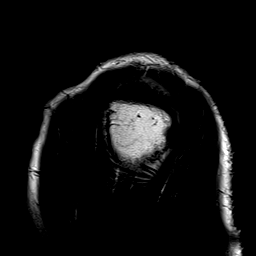
[im 21/24]
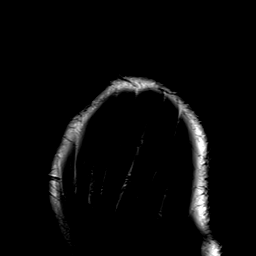
[im 24/24]
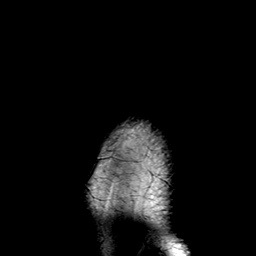

[Series 10: T2 fat-sat · oblique · 4.0mm · 0.59mm/px · 9 of 24 slices shown (2 of 2)]
[im 1/24]
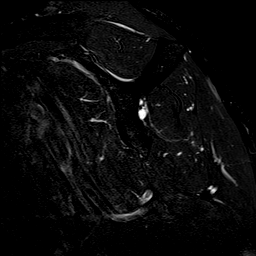
[im 3/24]
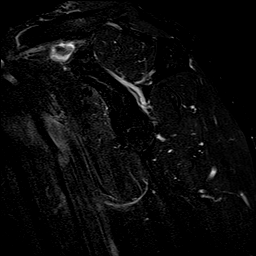
[im 6/24]
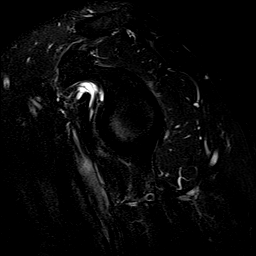
[im 9/24]
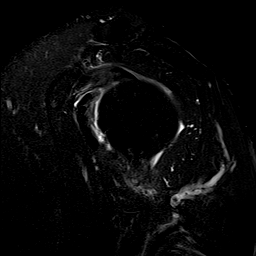
[im 12/24]
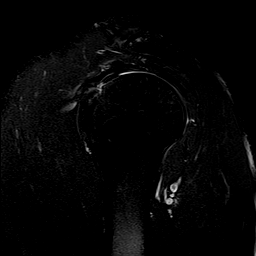
[im 15/24]
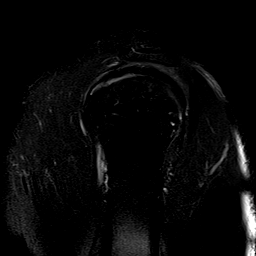
[im 18/24]
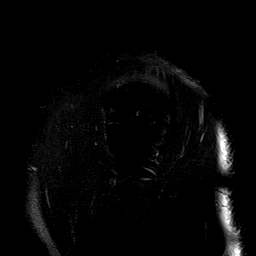
[im 21/24]
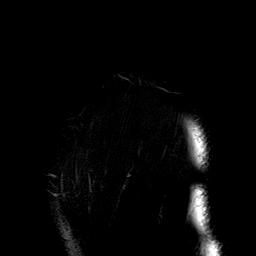
[im 24/24]
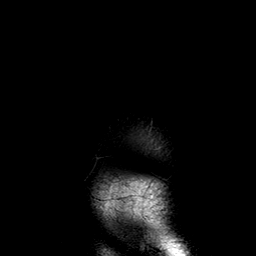

[40 of 40 positions shown; findings below may reference images not displayed]

FINDINGS: Rotator cuff: Low to intermediate grade partial-thickness articular
sided tear of the anterior and mid portions of the supraspinatus
tendon involving 25-50% of the tendon depth (series 7, images
11-13). No full-thickness or retracted component. Infraspinatus,
subscapularis, and teres minor tendons intact.

Muscles: No atrophy or abnormal signal of the muscles of the rotator
cuff.

Biceps long head: Linear signal within the intra-articular portion
of the biceps tendon (series 10, images [DATE] represent
tendinosis versus a longitudinal split tear. Extra-articular portion
of the tendon appears unremarkable.

Acromioclavicular Joint: Minimal arthropathy of the
acromioclavicular joint. No subacromial/subdeltoid bursal fluid.

Glenohumeral Joint: Trace joint effusion. No chondral defect.

Labrum: Blunted, heterogeneous appearance of the anteroinferior
labrum (series 6, image 17).

Bones:  No marrow abnormality, fracture or dislocation.

Other: None.
IMPRESSION: 1. Low to intermediate grade partial-thickness articular sided tear
of the anterior and mid portions of the supraspinatus tendon. No
full-thickness or retracted component.
2. Linear signal within the intra-articular portion of the biceps
tendon may represent tendinosis versus a partial-thickness
longitudinal split tear.
3. Anteroinferior labral degeneration versus tear.

## 2019-01-15 ENCOUNTER — Ambulatory Visit (INDEPENDENT_AMBULATORY_CARE_PROVIDER_SITE_OTHER): Payer: BC Managed Care – PPO | Admitting: Sports Medicine

## 2019-01-15 ENCOUNTER — Encounter: Payer: Self-pay | Admitting: Sports Medicine

## 2019-01-15 ENCOUNTER — Other Ambulatory Visit: Payer: Self-pay

## 2019-01-15 DIAGNOSIS — M25512 Pain in left shoulder: Secondary | ICD-10-CM | POA: Diagnosis not present

## 2019-01-15 DIAGNOSIS — G8929 Other chronic pain: Secondary | ICD-10-CM

## 2019-01-15 NOTE — Assessment & Plan Note (Signed)
Did not get much relief with subacromial injection, glenohumeral this time after MRI results were noted. Return to see me in a month, referral to Dr. Griffin Basil for arthroscopy if no better.

## 2019-01-15 NOTE — Progress Notes (Signed)
Subjective:    CC: Left shoulder pain  HPI: This is a pleasant 58 year old male with longstanding left shoulder pain, initially we did a subacromial injection, formal physical therapy, he had a slight improvement persistent discomfort, eventually we obtained an MRI that showed articular sided tearing of the rotator cuff, biceps tendinosis.  Pain is moderate, persistent, localized without radiation, he does have some loss of range of motion.  I reviewed the past medical history, family history, social history, surgical history, and allergies today and no changes were needed.  Please see the problem list section below in epic for further details.  Past Medical History: No past medical history on file. Past Surgical History: No past surgical history on file. Social History: Social History   Socioeconomic History  . Marital status: Married    Spouse name: Not on file  . Number of children: Not on file  . Years of education: Not on file  . Highest education level: Not on file  Occupational History  . Not on file  Tobacco Use  . Smoking status: Never Smoker  . Smokeless tobacco: Never Used  Substance and Sexual Activity  . Alcohol use: Yes    Comment: daily  . Drug use: No  . Sexual activity: Not on file  Other Topics Concern  . Not on file  Social History Narrative  . Not on file   Social Determinants of Health   Financial Resource Strain:   . Difficulty of Paying Living Expenses: Not on file  Food Insecurity:   . Worried About Charity fundraiser in the Last Year: Not on file  . Ran Out of Food in the Last Year: Not on file  Transportation Needs:   . Lack of Transportation (Medical): Not on file  . Lack of Transportation (Non-Medical): Not on file  Physical Activity:   . Days of Exercise per Week: Not on file  . Minutes of Exercise per Session: Not on file  Stress:   . Feeling of Stress : Not on file  Social Connections:   . Frequency of Communication with Friends  and Family: Not on file  . Frequency of Social Gatherings with Friends and Family: Not on file  . Attends Religious Services: Not on file  . Active Member of Clubs or Organizations: Not on file  . Attends Archivist Meetings: Not on file  . Marital Status: Not on file   Family History: Family History  Problem Relation Age of Onset  . Stroke Mother   . Alcohol abuse Father   . Seizures Father    Allergies: No Known Allergies Medications: See med rec.  Review of Systems: No fevers, chills, night sweats, weight loss, chest pain, or shortness of breath.   Objective:    General: Well Developed, well nourished, and in no acute distress.  Neuro: Alert and oriented x3, extra-ocular muscles intact, sensation grossly intact.  HEENT: Normocephalic, atraumatic, pupils equal round reactive to light, neck supple, no masses, no lymphadenopathy, thyroid nonpalpable.  Skin: Warm and dry, no rashes. Cardiac: Regular rate and rhythm, no murmurs rubs or gallops, no lower extremity edema.  Respiratory: Clear to auscultation bilaterally. Not using accessory muscles, speaking in full sentences. Left shoulder: Inspection reveals no abnormalities, atrophy or asymmetry. Palpation is normal with no tenderness over AC joint or bicipital groove. Has about 30 degrees of external rotation lag of the left shoulder compared to the right Rotator cuff strength normal throughout. No signs of impingement with negative Neer and  Hawkin's tests, empty can. Speeds and Yergason's tests normal. No labral pathology noted with negative Obrien's, negative crank, negative clunk, and good stability. Normal scapular function observed. No painful arc and no drop arm sign. No apprehension sign  Procedure: Real-time Ultrasound Guided injection of the left glenohumeral joint Device: Samsung HS60  Verbal informed consent obtained.  Time-out conducted.  Noted no overlying erythema, induration, or other signs of local  infection.  Skin prepped in a sterile fashion.  Local anesthesia: Topical Ethyl chloride.  With sterile technique and under real time ultrasound guidance: 1 cc Kenalog 40, 2 cc lidocaine, 2 cc bupivacaine injected easily Completed without difficulty  Pain immediately resolved suggesting accurate placement of the medication.  Advised to call if fevers/chills, erythema, induration, drainage, or persistent bleeding.  Images permanently stored and available for review in the ultrasound unit.  Impression: Technically successful ultrasound guided injection.  Impression and Recommendations:    Chronic left shoulder pain Did not get much relief with subacromial injection, glenohumeral this time after MRI results were noted. Return to see me in a month, referral to Dr. Everardo Pacific for arthroscopy if no better.   ___________________________________________ [name]. Benjamin Stain, M.D., ABFM., CAQSM. Primary Care and Sports Medicine [facility]  Adjunct Professor of Family Medicine  [employer]

## 2019-02-12 ENCOUNTER — Encounter: Payer: Self-pay | Admitting: Sports Medicine

## 2019-02-12 ENCOUNTER — Ambulatory Visit (INDEPENDENT_AMBULATORY_CARE_PROVIDER_SITE_OTHER): Payer: BC Managed Care – PPO | Admitting: Sports Medicine

## 2019-02-12 DIAGNOSIS — M25512 Pain in left shoulder: Secondary | ICD-10-CM | POA: Diagnosis not present

## 2019-02-12 DIAGNOSIS — G8929 Other chronic pain: Secondary | ICD-10-CM | POA: Diagnosis not present

## 2019-02-12 NOTE — Assessment & Plan Note (Signed)
T is a pleasant 59 year old male, we did a subacromial injection at the initial visit, he was having impingement type symptoms, this did not provide much relief, we subsequently proceeded with a glenohumeral injection and he reports near complete pain relief, he is happy with how things are going, and feels as though he can live with it. I did discuss with him the lack of ability to cure osteoarthritis, and the fact that we would simply manage it. We discussed advanced treatments including biologic agents, he will return to see me as needed.

## 2019-02-12 NOTE — Progress Notes (Signed)
   Virtual Visit via WebEx/MyChart   I connected with  Ernest Bierlein Jr.  on 02/12/19 via WebEx/MyChart/Doximity Video and verified that I am speaking with the correct person using two identifiers.   I discussed the limitations, risks, security and privacy concerns of performing an evaluation and management service by WebEx/MyChart/Doximity Video, including the higher likelihood of inaccurate diagnosis and treatment, and the availability of in person appointments.  We also discussed the likely need of an additional face to face encounter for complete and high quality delivery of care.  I also discussed with the patient that there may be a patient responsible charge related to this service. The patient expressed understanding and wishes to proceed.  Provider location is either at home or medical facility. Patient location is at their home, different from provider location. People involved in care of the patient during this telehealth encounter were myself, my nurse/medical assistant, and my front office/scheduling team member.  Review of Systems: No fevers, chills, night sweats, weight loss, chest pain, or shortness of breath.   Objective Findings:    General: Speaking full sentences, no audible heavy breathing.  Sounds alert and appropriately interactive.  Appears well.  Face symmetric.  Extraocular movements intact.  Pupils equal and round.  No nasal flaring or accessory muscle use visualized.  Independent interpretation of tests performed by another provider:   None.  Impression and Recommendations:    Chronic left shoulder pain T is a pleasant 59 year old male, we did a subacromial injection at the initial visit, he was having impingement type symptoms, this did not provide much relief, we subsequently proceeded with a glenohumeral injection and he reports near complete pain relief, he is happy with how things are going, and feels as though he can live with it. I did discuss with him  the lack of ability to cure osteoarthritis, and the fact that we would simply manage it. We discussed advanced treatments including biologic agents, he will return to see me as needed.  I discussed the above assessment and treatment plan with the patient. The patient was provided an opportunity to ask questions and all were answered. The patient agreed with the plan and demonstrated an understanding of the instructions.   The patient was advised to call back or seek an in-person evaluation if the symptoms worsen or if the condition fails to improve as anticipated.   I provided 30 minutes of face to face and non-face-to-face time during this encounter date, time was needed to gather information, review chart, records, communicate/coordinate with staff remotely, as well as complete documentation.   ___________________________________________ [name]. Benjamin Stain, M.D., ABFM., CAQSM. Primary Care and Sports Medicine [facility] [facility]  Adjunct Instructor of Family Medicine  [employer]

## 2019-04-11 DIAGNOSIS — Z23 Encounter for immunization: Secondary | ICD-10-CM | POA: Diagnosis not present

## 2019-05-02 DIAGNOSIS — Z23 Encounter for immunization: Secondary | ICD-10-CM | POA: Diagnosis not present

## 2019-05-11 ENCOUNTER — Other Ambulatory Visit: Payer: Self-pay | Admitting: Sports Medicine

## 2019-05-11 DIAGNOSIS — G4700 Insomnia, unspecified: Secondary | ICD-10-CM

## 2019-05-17 ENCOUNTER — Encounter: Payer: Self-pay | Admitting: Sports Medicine

## 2019-05-17 ENCOUNTER — Other Ambulatory Visit: Payer: Self-pay

## 2019-05-17 ENCOUNTER — Ambulatory Visit (INDEPENDENT_AMBULATORY_CARE_PROVIDER_SITE_OTHER): Payer: BC Managed Care – PPO | Admitting: Sports Medicine

## 2019-05-17 DIAGNOSIS — G8929 Other chronic pain: Secondary | ICD-10-CM

## 2019-05-17 DIAGNOSIS — H9319 Tinnitus, unspecified ear: Secondary | ICD-10-CM | POA: Insufficient documentation

## 2019-05-17 DIAGNOSIS — M25512 Pain in left shoulder: Secondary | ICD-10-CM | POA: Diagnosis not present

## 2019-05-17 DIAGNOSIS — H9313 Tinnitus, bilateral: Secondary | ICD-10-CM | POA: Diagnosis not present

## 2019-05-17 NOTE — Assessment & Plan Note (Signed)
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 Today his symptoms are referrable more to the bicep sheath so we did a bicep tendon injection with ultrasound guidance. Adding biceps rehab exercises and at this point I would like a second opinion from Dr. Everardo Pacific.

## 2019-05-17 NOTE — Progress Notes (Signed)
    Procedures performed today:    Procedure: Real-time Ultrasound Guided injection of the left biceps tendon sheath Device: Samsung HS60  Verbal informed consent obtained.  Time-out conducted.  Noted no overlying erythema, induration, or other signs of local infection.  Skin prepped in a sterile fashion.  Local anesthesia: Topical Ethyl chloride.  With sterile technique and under real time ultrasound guidance: 1 cc Kenalog 40, 1 cc lidocaine, 1 cc bupivacaine injected easily Completed without difficulty  Pain immediately resolved suggesting accurate placement of the medication.  Advised to call if fevers/chills, erythema, induration, drainage, or persistent bleeding.  Images permanently stored and available for review in the ultrasound unit.  Impression: Technically successful ultrasound guided injection.  Independent interpretation of notes and tests performed by another provider:   None.  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 Today his symptoms are referrable more to the bicep sheath so we did a bicep tendon injection with ultrasound guidance. Adding biceps rehab exercises and at this point I would like a second opinion from Dr. Everardo Pacific.  Tinnitus Bilateral tinnitus, likely idiopathic. Ear exam is unremarkable, his hearing is grossly normal, I would like ENT to weigh in, I did describe the untreatable nature of idiopathic tinnitus.    ___________________________________________ [name]. Benjamin Stain, M.D., ABFM., CAQSM. Primary Care and Sports Medicine [facility] [facility]  Adjunct Instructor of Family Medicine  [employer]

## 2019-05-17 NOTE — Assessment & Plan Note (Signed)
Bilateral tinnitus, likely idiopathic. Ear exam is unremarkable, his hearing is grossly normal, I would like ENT to weigh in, I did describe the untreatable nature of idiopathic tinnitus.

## 2019-05-28 DIAGNOSIS — M25512 Pain in left shoulder: Secondary | ICD-10-CM | POA: Diagnosis not present

## 2019-06-04 DIAGNOSIS — J309 Allergic rhinitis, unspecified: Secondary | ICD-10-CM | POA: Diagnosis not present

## 2019-06-04 DIAGNOSIS — H903 Sensorineural hearing loss, bilateral: Secondary | ICD-10-CM | POA: Diagnosis not present

## 2019-06-04 DIAGNOSIS — H9313 Tinnitus, bilateral: Secondary | ICD-10-CM | POA: Diagnosis not present

## 2019-10-04 ENCOUNTER — Other Ambulatory Visit: Payer: Self-pay | Admitting: Sports Medicine

## 2019-10-04 DIAGNOSIS — N138 Other obstructive and reflux uropathy: Secondary | ICD-10-CM

## 2019-10-15 ENCOUNTER — Encounter: Payer: Self-pay | Admitting: Sports Medicine

## 2019-10-15 ENCOUNTER — Ambulatory Visit (INDEPENDENT_AMBULATORY_CARE_PROVIDER_SITE_OTHER): Payer: BC Managed Care – PPO | Admitting: Family Medicine

## 2019-10-15 ENCOUNTER — Other Ambulatory Visit: Payer: Self-pay

## 2019-10-15 ENCOUNTER — Encounter: Payer: Self-pay | Admitting: Family Medicine

## 2019-10-15 VITALS — BP 136/95 | HR 69 | Wt 208.0 lb

## 2019-10-15 DIAGNOSIS — N419 Inflammatory disease of prostate, unspecified: Secondary | ICD-10-CM

## 2019-10-15 DIAGNOSIS — R350 Frequency of micturition: Secondary | ICD-10-CM | POA: Diagnosis not present

## 2019-10-15 DIAGNOSIS — R309 Painful micturition, unspecified: Secondary | ICD-10-CM

## 2019-10-15 LAB — POCT URINALYSIS DIP (CLINITEK)
Bilirubin, UA: NEGATIVE
Blood, UA: NEGATIVE
Glucose, UA: NEGATIVE mg/dL
Ketones, POC UA: NEGATIVE mg/dL
Leukocytes, UA: NEGATIVE
Nitrite, UA: NEGATIVE
POC PROTEIN,UA: NEGATIVE
Spec Grav, UA: 1.025 (ref 1.010–1.025)
Urobilinogen, UA: 0.2 E.U./dL
pH, UA: 6 (ref 5.0–8.0)

## 2019-10-15 LAB — MICROALBUMIN, URINE: Microalb, Ur: NORMAL

## 2019-10-15 MED ORDER — TAMSULOSIN HCL 0.4 MG PO CAPS: 0.4000 mg | ORAL_CAPSULE | Freq: Every day | ORAL | 3 refills | Status: DC

## 2019-10-15 MED ORDER — SULFAMETHOXAZOLE-TRIMETHOPRIM 800-160 MG PO TABS: 1.0000 | ORAL_TABLET | Freq: Two times a day (BID) | ORAL | 0 refills | Status: AC

## 2019-10-15 MED ORDER — SULFAMETHOXAZOLE-TRIMETHOPRIM 800-160 MG PO TABS: 1.0000 | ORAL_TABLET | Freq: Two times a day (BID) | ORAL | 0 refills | Status: DC

## 2019-10-15 MED FILL — SULFAMETHOXAZOLE-TMP DS TAB: 800-160 | 42 days supply | Qty: 84 | Fill #0

## 2019-10-15 MED FILL — TAMSULOSIN HCL 0.4 MG CAP: 0.4 | 30 days supply | Qty: 30 | Fill #0

## 2019-10-15 NOTE — Patient Instructions (Signed)
Have labs completed.  Start tamsulosin daily.  Take antibiotic (trimethoprim-sulfamethoxazole) twice per day for 4 weeks.  Follow up after completion of antibiotics.     Prostatitis  Prostatitis is swelling or inflammation of the prostate gland. The prostate is a walnut-sized gland that is involved in the production of semen. It is located below a man's bladder, in front of the rectum. There are four types of prostatitis:  Chronic nonbacterial prostatitis. This is the most common type of prostatitis. It may be associated with a viral infection or autoimmune disorder.  Acute bacterial prostatitis. This is the least common type of prostatitis. It starts quickly and is usually associated with a bladder infection, high fever, and shaking chills. It can occur at any age.  Chronic bacterial prostatitis. This type usually results from acute bacterial prostatitis that happens repeatedly (is recurrent) or has not been treated properly. It can occur in men of any age but is most common among middle-aged men whose prostate has begun to get larger. The symptoms are not as severe as symptoms caused by acute bacterial prostatitis.  Prostatodynia or chronic pelvic pain syndrome (CPPS). This type is also called pelvic floor disorder. It is associated with increased muscular tone in the pelvis surrounding the prostate. What are the causes? Bacterial prostatitis is caused by infection from bacteria. Chronic nonbacterial prostatitis may be caused by:  Urinary tract infections (UTIs).  Nerve damage.  A response by the body's disease-fighting system (autoimmune response).  Chemicals in the urine. The causes of the other types of prostatitis are usually not known. What are the signs or symptoms? Symptoms of this condition vary depending upon the type of prostatitis. If you have acute bacterial prostatitis, you may experience:  Urinary symptoms, such as: ? Painful urination. ? Burning during  urination. ? Frequent and sudden urges to urinate. ? Inability to start urinating. ? A weak or interrupted stream of urine.  Vomiting.  Nausea.  Fever.  Chills.  Inability to empty the bladder completely.  Pain in the: ? Muscles or joints. ? Lower back. ? Lower abdomen. If you have any of the other types of prostatitis, you may experience:  Urinary symptoms, such as: ? Sudden urges to urinate. ? Frequent urination. ? Difficulty starting urination. ? Weak urine stream. ? Dribbling after urination.  Discharge from the urethra. The urethra is a tube that opens at the end of the penis.  Pain in the: ? Testicles. ? Penis or tip of the penis. ? Rectum. ? Area in front of the rectum and below the scrotum (perineum).  Problems with sexual function.  Painful ejaculation.  Bloody semen. How is this diagnosed? This condition may be diagnosed based on:  A physical and medical exam.  Your symptoms.  A urine test to check for bacteria.  An exam in which a health care provider uses a finger to feel the prostate (digital rectal exam).  A test of a sample of semen.  Blood tests.  Ultrasound.  Removal of prostate tissue to be examined under a microscope (biopsy).  Tests to check how your body handles urine (urodynamic tests).  A test to look inside your bladder or urethra (cystoscopy). How is this treated? Treatment for this condition depends on the type of prostatitis. Treatment may involve:  Medicines to relieve pain or inflammation.  Medicines to help relax your muscles.  Physical therapy.  Heat therapy.  Techniques to help you control certain body functions (biofeedback).  Relaxation exercises.  Antibiotic medicine, if your  condition is caused by bacteria.  Warm water baths (sitz baths). Sitz baths help with relaxing your pelvic floor muscles, which helps to relieve pressure on the prostate. Follow these instructions at home:   Take  over-the-counter and prescription medicines only as told by your health care provider.  If you were prescribed an antibiotic, take it as told by your health care provider. Do not stop taking the antibiotic even if you start to feel better.  If physical therapy, biofeedback, or relaxation exercises were prescribed, do exercises as instructed.  Take sitz baths as directed by your health care provider. For a sitz bath, sit in warm water that is deep enough to cover your hips and buttocks.  Keep all follow-up visits as told by your health care provider. This is important. Contact a health care provider if:  Your symptoms get worse.  You have a fever. Get help right away if:  You have chills.  You feel nauseous.  You vomit.  You feel light-headed or feel like you are going to faint.  You are unable to urinate.  You have blood or blood clots in your urine. This information is not intended to replace advice given to you by your health care provider. Make sure you discuss any questions you have with your health care provider. Document Revised: 04/05/2017 Document Reviewed: 10/12/2015 Elsevier Patient Education  2020 [employer].

## 2019-10-16 LAB — URINE CULTURE
MICRO NUMBER:: 10935623
Result:: NO GROWTH
SPECIMEN QUALITY:: ADEQUATE

## 2019-10-16 LAB — CBC WITH DIFFERENTIAL/PLATELET
Absolute Monocytes: 328 cells/uL (ref 200–950)
Basophils Absolute: 29 cells/uL (ref 0–200)
Basophils Relative: 1 %
Eosinophils Absolute: 78 cells/uL (ref 15–500)
Eosinophils Relative: 2.7 %
HCT: 43.3 % (ref 38.5–50.0)
Hemoglobin: 14.3 g/dL (ref 13.2–17.1)
Lymphs Abs: 1241 cells/uL (ref 850–3900)
MCH: 30.4 pg (ref 27.0–33.0)
MCHC: 33 g/dL (ref 32.0–36.0)
MCV: 91.9 fL (ref 80.0–100.0)
MPV: 9.3 fL (ref 7.5–12.5)
Monocytes Relative: 11.3 %
Neutro Abs: 1224 cells/uL — ABNORMAL LOW (ref 1500–7800)
Neutrophils Relative %: 42.2 %
Platelets: 239 10*3/uL (ref 140–400)
RBC: 4.71 10*6/uL (ref 4.20–5.80)
RDW: 12.1 % (ref 11.0–15.0)
Total Lymphocyte: 42.8 %
WBC: 2.9 10*3/uL — ABNORMAL LOW (ref 3.8–10.8)

## 2019-10-16 LAB — PSA: PSA: 0.4 ng/mL (ref <=4.0)

## 2019-10-17 ENCOUNTER — Other Ambulatory Visit: Payer: Self-pay | Admitting: Sports Medicine

## 2019-10-17 DIAGNOSIS — N419 Inflammatory disease of prostate, unspecified: Secondary | ICD-10-CM | POA: Insufficient documentation

## 2019-10-17 DIAGNOSIS — E785 Hyperlipidemia, unspecified: Secondary | ICD-10-CM

## 2019-10-17 NOTE — Assessment & Plan Note (Signed)
He has symptoms that are consistent with prostatitis.  UA is unremarkable.  Sent for culture.  Checking PSA and CBC today. Will cover empirically with bactrim DS BID x4 weeks.  Will add flomax as well for obstructive symptoms.  Instructed to contact our clinic if he is having new or worsening symptoms.

## 2019-10-17 NOTE — Progress Notes (Signed)
Ernest Nguyen. - 59 y.o. male MRN [id number]  Date of birth: Jun 21, 1960  Subjective Chief Complaint  Patient presents with  . Urinary Frequency    HPI Ernest Nguyen. is a 59 y.o. male with history of BPH here today with complaint of urinary frequency and discomfort.  He reports that symptoms started a few days ago.  He has some suprapubic pain as well as some perineal pain.  He states that he has had to strain more to get started urinating and leaks urine after he goes to the bathroom.  He has not noticed blood in his urine.  He denies fever, chills, changes to bowels.   ROS:  A comprehensive ROS was completed and negative except as noted per HPI   No Known Allergies  History reviewed. No pertinent past medical history.  History reviewed. No pertinent surgical history.  Social History   Socioeconomic History  . Marital status: Married    Spouse name: Not on file  . Number of children: Not on file  . Years of education: Not on file  . Highest education level: Not on file  Occupational History  . Not on file  Tobacco Use  . Smoking status: Never Smoker  . Smokeless tobacco: Never Used  Substance and Sexual Activity  . Alcohol use: Yes    Comment: daily  . Drug use: No  . Sexual activity: Not on file  Other Topics Concern  . Not on file  Social History Narrative  . Not on file   Social Determinants of Health   Financial Resource Strain:   . Difficulty of Paying Living Expenses: Not on file  Food Insecurity:   . Worried About Programme researcher, broadcasting/film/video in the Last Year: Not on file  . Ran Out of Food in the Last Year: Not on file  Transportation Needs:   . Lack of Transportation (Medical): Not on file  . Lack of Transportation (Non-Medical): Not on file  Physical Activity:   . Days of Exercise per Week: Not on file  . Minutes of Exercise per Session: Not on file  Stress:   . Feeling of Stress : Not on file  Social Connections:   . Frequency of Communication  with Friends and Family: Not on file  . Frequency of Social Gatherings with Friends and Family: Not on file  . Attends Religious Services: Not on file  . Active Member of Clubs or Organizations: Not on file  . Attends Banker Meetings: Not on file  . Marital Status: Not on file    Family History  Problem Relation Age of Onset  . Stroke Mother   . Alcohol abuse Father   . Seizures Father     Health Maintenance  Topic Date Due  . INFLUENZA VACCINE  09/05/2019  . COLONOSCOPY  10/06/2019  . TETANUS/TDAP  12/18/2021  . COVID-19 Vaccine  Completed  . Hepatitis C Screening  Completed  . HIV Screening  Completed     ----------------------------------------------------------------------------------------------------------------------------------------------------------------------------------------------------------------- Physical Exam BP (!) 136/95 (BP Location: Left Arm, Patient Position: Sitting, Cuff Size: Large)   Pulse 69   Wt 208 lb (94.3 kg)   SpO2 (!) 69%   BMI 28.21 kg/m   Physical Exam Constitutional:      Appearance: Normal appearance.  HENT:     Head: Normocephalic and atraumatic.  Eyes:     General: No scleral icterus. Cardiovascular:     Rate and Rhythm: Normal rate and regular rhythm.  Pulmonary:  Effort: Pulmonary effort is normal.  Musculoskeletal:     Cervical back: Neck supple.  Neurological:     General: No focal deficit present.     Mental Status: He is alert.  Psychiatric:        Mood and Affect: Mood normal.        Behavior: Behavior normal.     ------------------------------------------------------------------------------------------------------------------------------------------------------------------------------------------------------------------- Assessment and Plan  Prostatitis He has symptoms that are consistent with prostatitis.  UA is unremarkable.  Sent for culture.  Checking PSA and CBC today. Will cover  empirically with bactrim DS BID x4 weeks.  Will add flomax as well for obstructive symptoms.  Instructed to contact our clinic if he is having new or worsening symptoms.    Meds ordered this encounter  Medications  . DISCONTD: sulfamethoxazole-trimethoprim (BACTRIM DS) 800-160 MG tablet    Sig: Take 1 tablet by mouth 2 (two) times daily.    Dispense:  84 tablet    Refill:  0  . DISCONTD: tamsulosin (FLOMAX) 0.4 MG CAPS capsule    Sig: Take 1 capsule (0.4 mg total) by mouth daily.    Dispense:  30 capsule    Refill:  3  . tamsulosin (FLOMAX) 0.4 MG CAPS capsule    Sig: Take 1 capsule (0.4 mg total) by mouth daily.    Dispense:  30 capsule    Refill:  3  . sulfamethoxazole-trimethoprim (BACTRIM DS) 800-160 MG tablet    Sig: Take 1 tablet by mouth 2 (two) times daily.    Dispense:  84 tablet    Refill:  0    No follow-ups on file.    This visit occurred during the SARS-CoV-2 public health emergency.  Safety protocols were in place, including screening questions prior to the visit, additional usage of staff PPE, and extensive cleaning of exam room while observing appropriate contact time as indicated for disinfecting solutions.

## 2019-11-12 ENCOUNTER — Telehealth: Payer: Self-pay | Admitting: Sports Medicine

## 2019-11-12 DIAGNOSIS — E785 Hyperlipidemia, unspecified: Secondary | ICD-10-CM

## 2019-11-12 DIAGNOSIS — Z0000 Encounter for general adult medical examination without abnormal findings: Secondary | ICD-10-CM

## 2019-11-12 NOTE — Telephone Encounter (Signed)
Annual labs pended. Does provider want to add additional testing? 

## 2019-11-12 NOTE — Telephone Encounter (Signed)
Task completed. Pt has been advised of annual lab order. Pt will complete by early next week. No other inquiries during call.

## 2019-11-12 NOTE — Telephone Encounter (Signed)
Looks good, signed off on.

## 2019-11-12 NOTE — Telephone Encounter (Signed)
Patient would like his labs ordered for his physical so he can go downstairs and get it done before appointment.

## 2019-11-15 ENCOUNTER — Telehealth: Payer: Self-pay | Admitting: Sports Medicine

## 2019-11-15 NOTE — Telephone Encounter (Signed)
Attempted to contact patient regarding colon cancer screening. Pt was not available at time of call. Per pt's wife - he had a colonoscopy 10 years ago and results were normal. She will have pt returned a call back to the clinic on whether he wants to do Cologuard or colonoscopy. Direct call back info provided.

## 2019-11-15 NOTE — Telephone Encounter (Signed)
Before I order the colonoscopy if he has had a normal recent colonoscopy then we can simply do Cologuard, let me know.

## 2019-11-15 NOTE — Telephone Encounter (Signed)
Dr. Karie Schwalbe   Patient called and left voicemail stating he was due for a colonoscopy and was wanting a referral to have this done.   cindy

## 2019-11-19 DIAGNOSIS — E785 Hyperlipidemia, unspecified: Secondary | ICD-10-CM | POA: Diagnosis not present

## 2019-11-19 DIAGNOSIS — Z0000 Encounter for general adult medical examination without abnormal findings: Secondary | ICD-10-CM | POA: Diagnosis not present

## 2019-11-20 LAB — COMPLETE METABOLIC PANEL WITH GFR
AG Ratio: 1.8 (calc) (ref 1.0–2.5)
ALT: 22 U/L (ref 9–46)
AST: 27 U/L (ref 10–35)
Albumin: 4.6 g/dL (ref 3.6–5.1)
Alkaline phosphatase (APISO): 61 U/L (ref 35–144)
BUN/Creatinine Ratio: 8 (calc) (ref 6–22)
BUN: 11 mg/dL (ref 7–25)
CO2: 29 mmol/L (ref 20–32)
Calcium: 9.6 mg/dL (ref 8.6–10.3)
Chloride: 101 mmol/L (ref 98–110)
Creat: 1.3 mg/dL — ABNORMAL HIGH (ref 0.70–1.25)
GFR, Est African American: 69 mL/min/{1.73_m2} (ref >=60)
GFR, Est Non African American: 59 mL/min/{1.73_m2} — ABNORMAL LOW (ref >=60)
Globulin: 2.6 g/dL (calc) (ref 1.9–3.7)
Glucose, Bld: 92 mg/dL (ref 65–99)
Potassium: 4.4 mmol/L (ref 3.5–5.3)
Sodium: 138 mmol/L (ref 135–146)
Total Bilirubin: 0.8 mg/dL (ref 0.2–1.2)
Total Protein: 7.2 g/dL (ref 6.1–8.1)

## 2019-11-20 LAB — LIPID PANEL W/REFLEX DIRECT LDL
Cholesterol: 159 mg/dL (ref <200)
HDL: 65 mg/dL (ref >=40)
LDL Cholesterol (Calc): 80 mg/dL (calc)
Non-HDL Cholesterol (Calc): 94 mg/dL (calc) (ref <130)
Total CHOL/HDL Ratio: 2.4 (calc) (ref <5.0)
Triglycerides: 56 mg/dL (ref <150)

## 2019-11-20 LAB — HEMOGLOBIN A1C
Hgb A1c MFr Bld: 5.4 % of total Hgb (ref <5.7)
Mean Plasma Glucose: 108 (calc)
eAG (mmol/L): 6 (calc)

## 2019-11-20 LAB — TSH: TSH: 1.88 mIU/L (ref 0.40–4.50)

## 2019-11-26 ENCOUNTER — Encounter: Payer: Self-pay | Admitting: Sports Medicine

## 2019-11-26 ENCOUNTER — Ambulatory Visit (INDEPENDENT_AMBULATORY_CARE_PROVIDER_SITE_OTHER): Payer: BC Managed Care – PPO | Admitting: Sports Medicine

## 2019-11-26 VITALS — BP 113/69 | HR 75 | Ht 72.0 in | Wt 206.0 lb

## 2019-11-26 DIAGNOSIS — N401 Enlarged prostate with lower urinary tract symptoms: Secondary | ICD-10-CM

## 2019-11-26 DIAGNOSIS — Z23 Encounter for immunization: Secondary | ICD-10-CM

## 2019-11-26 DIAGNOSIS — Z0000 Encounter for general adult medical examination without abnormal findings: Secondary | ICD-10-CM | POA: Diagnosis not present

## 2019-11-26 DIAGNOSIS — G8929 Other chronic pain: Secondary | ICD-10-CM

## 2019-11-26 DIAGNOSIS — N138 Other obstructive and reflux uropathy: Secondary | ICD-10-CM

## 2019-11-26 DIAGNOSIS — M25512 Pain in left shoulder: Secondary | ICD-10-CM | POA: Diagnosis not present

## 2019-11-26 MED ORDER — TADALAFIL 5 MG PO TABS: 5.0000 mg | ORAL_TABLET | Freq: Every day | ORAL | 3 refills | Status: DC

## 2019-11-26 MED ORDER — TAMSULOSIN HCL 0.4 MG PO CAPS: 0.4000 mg | ORAL_CAPSULE | Freq: Every day | ORAL | 3 refills | Status: DC

## 2019-11-26 NOTE — Assessment & Plan Note (Addendum)
Annual physical as above, routine labs look okay, see below for renal insufficiency. Flu and Shingrix today, return in 2 to 3 months for Shingrix No. 2.

## 2019-11-26 NOTE — Assessment & Plan Note (Signed)
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 He was scheduled for surgery but opted out at the last minute, it sounds as though his shoulder is tolerable at this point so we will avoid further intervention though we can certainly do a subacromial and glenohumeral injection in the future if he desires.

## 2019-11-26 NOTE — Assessment & Plan Note (Signed)
Currently on Flomax, he is on Cialis as well but not using it daily. He did have an episode of prostatitis, on his labs I did see some slight abnormalities in renal function and he still has approximately 3 episodes of nocturia every day. Continue Flomax daily, increase Cialis to daily. If persistent symptoms after 6 weeks we will consider the addition of 5 alpha reductase inhibition and I would also like to recheck his labs at that time to ensure renal function drops back down to normal.

## 2019-11-26 NOTE — Progress Notes (Signed)
Subjective:    CC: Annual Physical Exam  HPI:  This patient is here for their annual physical  I reviewed the past medical history, family history, social history, surgical history, and allergies today and no changes were needed.  Please see the problem list section below in epic for further details.  Past Medical History: No past medical history on file. Past Surgical History: No past surgical history on file. Social History: Social History   Socioeconomic History  . Marital status: Married    Spouse name: Not on file  . Number of children: Not on file  . Years of education: Not on file  . Highest education level: Not on file  Occupational History  . Not on file  Tobacco Use  . Smoking status: Never Smoker  . Smokeless tobacco: Never Used  Substance and Sexual Activity  . Alcohol use: Yes    Comment: daily  . Drug use: No  . Sexual activity: Not on file  Other Topics Concern  . Not on file  Social History Narrative  . Not on file   Social Determinants of Health   Financial Resource Strain:   . Difficulty of Paying Living Expenses: Not on file  Food Insecurity:   . Worried About Programme researcher, broadcasting/film/video in the Last Year: Not on file  . Ran Out of Food in the Last Year: Not on file  Transportation Needs:   . Lack of Transportation (Medical): Not on file  . Lack of Transportation (Non-Medical): Not on file  Physical Activity:   . Days of Exercise per Week: Not on file  . Minutes of Exercise per Session: Not on file  Stress:   . Feeling of Stress : Not on file  Social Connections:   . Frequency of Communication with Friends and Family: Not on file  . Frequency of Social Gatherings with Friends and Family: Not on file  . Attends Religious Services: Not on file  . Active Member of Clubs or Organizations: Not on file  . Attends Banker Meetings: Not on file  . Marital Status: Not on file   Family History: Family History  Problem Relation Age of Onset   . Stroke Mother   . Alcohol abuse Father   . Seizures Father    Allergies: No Known Allergies Medications: See med rec.  Review of Systems: No headache, visual changes, nausea, vomiting, diarrhea, constipation, dizziness, abdominal pain, skin rash, fevers, chills, night sweats, swollen lymph nodes, weight loss, chest pain, body aches, joint swelling, muscle aches, shortness of breath, mood changes, visual or auditory hallucinations.  Objective:    General: Well Developed, well nourished, and in no acute distress.  Neuro: Alert and oriented x3, extra-ocular muscles intact, sensation grossly intact. Cranial nerves II through XII are intact, motor, sensory, and coordinative functions are all intact. HEENT: Normocephalic, atraumatic, pupils equal round reactive to light, neck supple, no masses, no lymphadenopathy, thyroid nonpalpable. Oropharynx, nasopharynx, external ear canals are unremarkable. Skin: Warm and dry, no rashes noted.  Cardiac: Regular rate and rhythm, no murmurs rubs or gallops.  Respiratory: Clear to auscultation bilaterally. Not using accessory muscles, speaking in full sentences.  Abdominal: Soft, nontender, nondistended, positive bowel sounds, no masses, no organomegaly.  Musculoskeletal: Shoulder, elbow, wrist, hip, knee, ankle stable, and with full range of motion.  Impression and Recommendations:    The patient was counselled, risk factors were discussed, anticipatory guidance given.  Annual physical exam Annual physical as above, routine labs look okay, see  below for renal insufficiency. Flu and Shingrix today, return in 2 to 3 months for Shingrix No. 2.  BPH with obstruction/lower urinary tract symptoms Currently on Flomax, he is on Cialis as well but not using it daily. He did have an episode of prostatitis, on his labs I did see some slight abnormalities in renal function and he still has approximately 3 episodes of nocturia every day. Continue Flomax daily,  increase Cialis to daily. If persistent symptoms after 6 weeks we will consider the addition of 5 alpha reductase inhibition and I would also like to recheck his labs at that time to ensure renal function drops back down to normal.  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 He was scheduled for surgery but opted out at the last minute, it sounds as though his shoulder is tolerable at this point so we will avoid further intervention though we can certainly do a subacromial and glenohumeral injection in the future if he desires.   ___________________________________________ [name]. Benjamin Stain, M.D., ABFM., CAQSM. Primary Care and Sports Medicine [place] [facility]  Adjunct Professor of Family Medicine  [employer]

## 2020-01-04 DIAGNOSIS — N401 Enlarged prostate with lower urinary tract symptoms: Secondary | ICD-10-CM | POA: Diagnosis not present

## 2020-01-04 DIAGNOSIS — N138 Other obstructive and reflux uropathy: Secondary | ICD-10-CM | POA: Diagnosis not present

## 2020-01-05 LAB — BASIC METABOLIC PANEL WITH GFR
BUN: 12 mg/dL (ref 7–25)
CO2: 31 mmol/L (ref 20–32)
Calcium: 9.6 mg/dL (ref 8.6–10.3)
Chloride: 102 mmol/L (ref 98–110)
Creat: 1.13 mg/dL (ref 0.70–1.25)
GFR, Est African American: 81 mL/min/{1.73_m2} (ref >=60)
GFR, Est Non African American: 70 mL/min/{1.73_m2} (ref >=60)
Glucose, Bld: 90 mg/dL (ref 65–99)
Potassium: 4.1 mmol/L (ref 3.5–5.3)
Sodium: 139 mmol/L (ref 135–146)

## 2020-01-07 ENCOUNTER — Telehealth (INDEPENDENT_AMBULATORY_CARE_PROVIDER_SITE_OTHER): Payer: BC Managed Care – PPO | Admitting: Sports Medicine

## 2020-01-07 ENCOUNTER — Other Ambulatory Visit: Payer: Self-pay

## 2020-01-07 DIAGNOSIS — Z0000 Encounter for general adult medical examination without abnormal findings: Secondary | ICD-10-CM

## 2020-01-07 DIAGNOSIS — Z1211 Encounter for screening for malignant neoplasm of colon: Secondary | ICD-10-CM | POA: Diagnosis not present

## 2020-01-07 DIAGNOSIS — N401 Enlarged prostate with lower urinary tract symptoms: Secondary | ICD-10-CM

## 2020-01-07 DIAGNOSIS — N138 Other obstructive and reflux uropathy: Secondary | ICD-10-CM | POA: Diagnosis not present

## 2020-01-07 NOTE — Progress Notes (Signed)
° °  Virtual Visit via WebEx/MyChart   I connected with  Ernest Nguyen.  on 01/07/20 via WebEx/MyChart/Doximity Video and verified that I am speaking with the correct person using two identifiers.   I discussed the limitations, risks, security and privacy concerns of performing an evaluation and management service by WebEx/MyChart/Doximity Video, including the higher likelihood of inaccurate diagnosis and treatment, and the availability of in person appointments.  We also discussed the likely need of an additional face to face encounter for complete and high quality delivery of care.  I also discussed with the patient that there may be a patient responsible charge related to this service. The patient expressed understanding and wishes to proceed.  Provider location is in medical facility. Patient location is at their home, different from provider location. People involved in care of the patient during this telehealth encounter were myself, my nurse/medical assistant, and my front office/scheduling team member.  Review of Systems: No fevers, chills, night sweats, weight loss, chest pain, or shortness of breath.   Objective Findings:    General: Speaking full sentences, no audible heavy breathing.  Sounds alert and appropriately interactive.  Appears well.  Face symmetric.  Extraocular movements intact.  Pupils equal and round.  No nasal flaring or accessory muscle use visualized.  Independent interpretation of tests performed by another provider:   None.  Brief History, Exam, Impression, and Recommendations:    BPH with obstruction/lower urinary tract symptoms This is a very pleasant 59 year old male, we treated him previously for prostatitis, ultimately he had some slight abnormalities in his renal function, and 3 episodes of nocturia daily. We continued Flomax increased to Cialis 5 mg dosage daily. Today he has had good improvement in his symptoms, to the point where he feels he can  live with it, we also recheck his renal function which came back normal. Continue current treatment dosing regimen, if worsening of symptoms or if he desires further optimization we can certainly increase Cialis to 10 mg daily and increase Flomax to twice daily.   I discussed the above assessment and treatment plan with the patient. The patient was provided an opportunity to ask questions and all were answered. The patient agreed with the plan and demonstrated an understanding of the instructions.   The patient was advised to call back or seek an in-person evaluation if the symptoms worsen or if the condition fails to improve as anticipated.   I provided 15 minutes of face to face and non-face-to-face time during this encounter date, time was needed to gather information, review chart, records, communicate/coordinate with staff remotely, as well as complete documentation.   ___________________________________________ [name]. Benjamin Stain, M.D., ABFM., CAQSM. Primary Care and Sports Medicine [place] [facility]  Adjunct Instructor of Family Medicine  [employer]

## 2020-01-07 NOTE — Assessment & Plan Note (Signed)
This is a very pleasant 59 year old male, we treated him previously for prostatitis, ultimately he had some slight abnormalities in his renal function, and 3 episodes of nocturia daily. We continued Flomax increased to Cialis 5 mg dosage daily. Today he has had good improvement in his symptoms, to the point where he feels he can live with it, we also recheck his renal function which came back normal. Continue current treatment dosing regimen, if worsening of symptoms or if he desires further optimization we can certainly increase Cialis to 10 mg daily and increase Flomax to twice daily.

## 2020-01-07 NOTE — Progress Notes (Signed)
Following up from previous OV. No specific concerns for today.

## 2020-02-07 ENCOUNTER — Ambulatory Visit: Payer: BC Managed Care – PPO | Admitting: Sports Medicine

## 2020-02-25 ENCOUNTER — Other Ambulatory Visit: Payer: Self-pay

## 2020-02-25 ENCOUNTER — Ambulatory Visit (INDEPENDENT_AMBULATORY_CARE_PROVIDER_SITE_OTHER): Payer: BC Managed Care – PPO | Admitting: Sports Medicine

## 2020-02-25 ENCOUNTER — Ambulatory Visit (INDEPENDENT_AMBULATORY_CARE_PROVIDER_SITE_OTHER): Payer: BC Managed Care – PPO

## 2020-02-25 DIAGNOSIS — M25512 Pain in left shoulder: Secondary | ICD-10-CM

## 2020-02-25 DIAGNOSIS — G8929 Other chronic pain: Secondary | ICD-10-CM | POA: Diagnosis not present

## 2020-02-25 DIAGNOSIS — M25511 Pain in right shoulder: Secondary | ICD-10-CM

## 2020-02-25 NOTE — Assessment & Plan Note (Signed)
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 He was scheduled for surgery but opted out as his shoulder did improve, unfortunately he is having similar symptoms on the right side, so today we performed right-sided subacromial and glenohumeral injections. Return to see me in 1 month. He will do the rotator cuff exercises along the way and eliminate overhead lifting from his gym regimen.

## 2020-02-25 NOTE — Progress Notes (Signed)
    Procedures performed today:    Procedure: Real-time Ultrasound Guided injection of the wound right glenohumeral joint Device: Samsung HS60  Verbal informed consent obtained.  Time-out conducted.  Noted no overlying erythema, induration, or other signs of local infection.  Skin prepped in a sterile fashion.  Local anesthesia: Topical Ethyl chloride.  With sterile technique and under real time ultrasound guidance: 1 cc Kenalog 40, 2 cc lidocaine, 2 cc bupivacaine injected easily Completed without difficulty  Advised to call if fevers/chills, erythema, induration, drainage, or persistent bleeding.  Images permanently stored and available for review in PACS.  Impression: Technically successful ultrasound guided injection.  Procedure: Real-time Ultrasound Guided injection of the right subacromial bursa Device: Samsung HS60  Verbal informed consent obtained.  Time-out conducted.  Noted no overlying erythema, induration, or other signs of local infection.  Skin prepped in a sterile fashion.  Local anesthesia: Topical Ethyl chloride.  With sterile technique and under real time ultrasound guidance: 1 cc Kenalog 40, 1 cc lidocaine, 1 cc bupivacaine injected easily Completed without difficulty  Advised to call if fevers/chills, erythema, induration, drainage, or persistent bleeding.  Images permanently stored and available for review in PACS.  Impression: Technically successful ultrasound guided injection.  Independent interpretation of notes and tests performed by another provider:   None.  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 He was scheduled for surgery but opted out as his shoulder did improve, unfortunately he is having similar symptoms on the right  side, so today we performed right-sided subacromial and glenohumeral injections. Return to see me in 1 month. He will do the rotator cuff exercises along the way and eliminate overhead lifting from his gym regimen.    ___________________________________________ Ernest Nguyen. Ernest Nguyen, M.D., ABFM., CAQSM. Primary Care and Sports Medicine  [facility]  Adjunct Instructor of Family Medicine  [employer]

## 2020-03-09 DIAGNOSIS — Z1211 Encounter for screening for malignant neoplasm of colon: Secondary | ICD-10-CM | POA: Diagnosis not present

## 2020-03-09 LAB — COLOGUARD: Cologuard: NEGATIVE

## 2020-03-16 LAB — COLOGUARD
COLOGUARD: NEGATIVE
Cologuard: NEGATIVE

## 2020-03-17 ENCOUNTER — Telehealth: Payer: Self-pay | Admitting: Neurology

## 2020-03-17 NOTE — Telephone Encounter (Signed)
Cologuard received with negative result. Patient made aware. Abstracted.

## 2020-03-31 ENCOUNTER — Telehealth (INDEPENDENT_AMBULATORY_CARE_PROVIDER_SITE_OTHER): Payer: BC Managed Care – PPO | Admitting: Sports Medicine

## 2020-03-31 DIAGNOSIS — N138 Other obstructive and reflux uropathy: Secondary | ICD-10-CM | POA: Diagnosis not present

## 2020-03-31 DIAGNOSIS — M25512 Pain in left shoulder: Secondary | ICD-10-CM

## 2020-03-31 DIAGNOSIS — M25511 Pain in right shoulder: Secondary | ICD-10-CM | POA: Diagnosis not present

## 2020-03-31 DIAGNOSIS — G8929 Other chronic pain: Secondary | ICD-10-CM

## 2020-03-31 DIAGNOSIS — N401 Enlarged prostate with lower urinary tract symptoms: Secondary | ICD-10-CM

## 2020-03-31 NOTE — Progress Notes (Signed)
   Virtual Visit via WebEx/MyChart   I connected with  Ernest Batie Jr.  on 03/31/20 via WebEx/MyChart/Doximity Video and verified that I am speaking with the correct person using two identifiers.   I discussed the limitations, risks, security and privacy concerns of performing an evaluation and management service by WebEx/MyChart/Doximity Video, including the higher likelihood of inaccurate diagnosis and treatment, and the availability of in person appointments.  We also discussed the likely need of an additional face to face encounter for complete and high quality delivery of care.  I also discussed with the patient that there may be a patient responsible charge related to this service. The patient expressed understanding and wishes to proceed.  Provider location is in medical facility. Patient location is at their home, different from provider location. People involved in care of the patient during this telehealth encounter were myself, my nurse/medical assistant, and my front office/scheduling team member.  Review of Systems: No fevers, chills, night sweats, weight loss, chest pain, or shortness of breath.   Objective Findings:    General: Speaking full sentences, no audible heavy breathing.  Sounds alert and appropriately interactive.  Appears well.  Face symmetric.  Extraocular movements intact.  Pupils equal and round.  No nasal flaring or accessory muscle use visualized.  Independent interpretation of tests performed by another provider:   None.  Brief History, Exam, Impression, and Recommendations:    BPH with obstruction/lower urinary tract symptoms This pleasant 60 year old male has doubled his Flomax dose, he has not noted much improvement in his urinary stream, no orthostasis. He can go up on his Cialis as well in the future if needed.  Bilateral shoulder pain Left shoulder continues to do well after subacromial and glenohumeral injections, MRI of the time showed chronic  degenerative changes in the rotator cuff, biceps sheath, inferior labrum, he was scheduled for surgery but opted out of the shoulder improved significantly with the injections, he was having symptoms on the right side so at the last visit we did subacromial and glenohumeral injections and he returns today doing well. He will limit overhead lifting at the gym and return as needed for this.   I discussed the above assessment and treatment plan with the patient. The patient was provided an opportunity to ask questions and all were answered. The patient agreed with the plan and demonstrated an understanding of the instructions.   The patient was advised to call back or seek an in-person evaluation if the symptoms worsen or if the condition fails to improve as anticipated.   I provided 30 minutes of face to face and non-face-to-face time during this encounter date, time was needed to gather information, review chart, records, communicate/coordinate with staff remotely, as well as complete documentation.   ___________________________________________ [name]. Benjamin Stain, M.D., ABFM., CAQSM. Primary Care and Sports Medicine [facility]  Adjunct Instructor of Family Medicine  [employer]

## 2020-03-31 NOTE — Assessment & Plan Note (Signed)
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 He will limit overhead lifting at the gym and return as needed for this.

## 2020-03-31 NOTE — Assessment & Plan Note (Signed)
This pleasant 60 year old male has doubled his Flomax dose, he has not noted much improvement in his urinary stream, no orthostasis. He can go up on his Cialis as well in the future if needed.

## 2020-03-31 NOTE — Progress Notes (Signed)
Right shoulder is doing better.

## 2020-04-13 ENCOUNTER — Other Ambulatory Visit: Payer: Self-pay

## 2020-04-13 ENCOUNTER — Ambulatory Visit (INDEPENDENT_AMBULATORY_CARE_PROVIDER_SITE_OTHER): Payer: BC Managed Care – PPO | Admitting: Sports Medicine

## 2020-04-13 VITALS — BP 135/89 | HR 71 | Temp 98.8°F | Resp 20 | Ht 72.0 in | Wt 206.0 lb

## 2020-04-13 DIAGNOSIS — Z23 Encounter for immunization: Secondary | ICD-10-CM | POA: Diagnosis not present

## 2020-04-13 NOTE — Progress Notes (Signed)
Established Patient Office Visit  Subjective:  Patient ID: Ernest Nguyen., male    DOB: 11/08/1960  Age: 60 y.o. MRN: [id number]  CC:  Chief Complaint  Patient presents with  . Immunizations    HPI Ernest Nguyen. presents for second Shingles vaccine.   History reviewed. No pertinent past medical history.  History reviewed. No pertinent surgical history.  Family History  Problem Relation Age of Onset  . Stroke Mother   . Alcohol abuse Father   . Seizures Father     Social History   Socioeconomic History  . Marital status: Married    Spouse name: Not on file  . Number of children: Not on file  . Years of education: Not on file  . Highest education level: Not on file  Occupational History  . Not on file  Tobacco Use  . Smoking status: Never Smoker  . Smokeless tobacco: Never Used  Substance and Sexual Activity  . Alcohol use: Yes    Comment: daily  . Drug use: No  . Sexual activity: Not on file  Other Topics Concern  . Not on file  Social History Narrative  . Not on file   Social Determinants of Health   Financial Resource Strain: Not on file  Food Insecurity: Not on file  Transportation Needs: Not on file  Physical Activity: Not on file  Stress: Not on file  Social Connections: Not on file  Intimate Partner Violence: Not on file    Outpatient Medications Prior to Visit  Medication Sig Dispense Refill  . atorvastatin (LIPITOR) 10 MG tablet TAKE 1 TABLET DAILY 90 tablet 3  . tadalafil (CIALIS) 5 MG tablet Take 1 tablet (5 mg total) by mouth daily. 90 tablet 3  . tamsulosin (FLOMAX) 0.4 MG CAPS capsule Take 2 capsules (0.8 mg total) by mouth daily.    Marland Kitchen zolpidem (AMBIEN) 10 MG tablet TAKE ONE TABLET BY MOUTH AT BEDTIME AS NEEDED FOR SLEEP  30 tablet 0   No facility-administered medications prior to visit.    No Known Allergies  ROS Review of Systems    Objective:    Physical Exam  BP 135/89 (BP Location: Left Arm, Patient Position:  Sitting, Cuff Size: Normal)   Pulse 71   Temp 98.8 F (37.1 C) (Oral)   Resp 20   Ht 6' (1.829 m)   Wt 206 lb (93.4 kg)   SpO2 100%   BMI 27.94 kg/m  Wt Readings from Last 3 Encounters:  04/13/20 206 lb (93.4 kg)  11/26/19 206 lb (93.4 kg)  10/15/19 208 lb (94.3 kg)     Health Maintenance Due  Topic Date Due  . COVID-19 Vaccine (3 - Booster for Pfizer series) 11/02/2019    There are no preventive care reminders to display for this patient.  Lab Results  Component Value Date   TSH 1.88 11/19/2019   Lab Results  Component Value Date   WBC 2.9 (L) 10/15/2019   HGB 14.3 10/15/2019   HCT 43.3 10/15/2019   MCV 91.9 10/15/2019   PLT 239 10/15/2019   Lab Results  Component Value Date   NA 139 01/04/2020   K 4.1 01/04/2020   CO2 31 01/04/2020   GLUCOSE 90 01/04/2020   BUN 12 01/04/2020   CREATININE 1.13 01/04/2020   BILITOT 0.8 11/19/2019   ALKPHOS 45 09/22/2015   AST 27 11/19/2019   ALT 22 11/19/2019   PROT 7.2 11/19/2019   ALBUMIN 4.3 09/22/2015   CALCIUM 9.6  01/04/2020   ANIONGAP 7 07/28/2017   Lab Results  Component Value Date   CHOL 159 11/19/2019   Lab Results  Component Value Date   HDL 65 11/19/2019   Lab Results  Component Value Date   LDLCALC 80 11/19/2019   Lab Results  Component Value Date   TRIG 56 11/19/2019   Lab Results  Component Value Date   CHOLHDL 2.4 11/19/2019   Lab Results  Component Value Date   HGBA1C 5.4 11/19/2019      Assessment & Plan:  Pt tolerated injection in left deltoid well without complications.  Problem List Items Addressed This Visit   None   Visit Diagnoses    Need for shingles vaccine    -  Primary   Relevant Orders   Varicella-zoster vaccine IM (Shingrix) (Completed)      No orders of the defined types were placed in this encounter.   Follow-up: No follow-ups on file.    Kathrynn Speed, CMA

## 2020-04-14 ENCOUNTER — Other Ambulatory Visit: Payer: Self-pay | Admitting: Sports Medicine

## 2020-04-14 DIAGNOSIS — G4700 Insomnia, unspecified: Secondary | ICD-10-CM

## 2020-05-08 ENCOUNTER — Ambulatory Visit (INDEPENDENT_AMBULATORY_CARE_PROVIDER_SITE_OTHER): Payer: BC Managed Care – PPO | Admitting: Sports Medicine

## 2020-05-08 ENCOUNTER — Ambulatory Visit (INDEPENDENT_AMBULATORY_CARE_PROVIDER_SITE_OTHER): Payer: BC Managed Care – PPO

## 2020-05-08 ENCOUNTER — Other Ambulatory Visit: Payer: Self-pay

## 2020-05-08 DIAGNOSIS — M5416 Radiculopathy, lumbar region: Secondary | ICD-10-CM

## 2020-05-08 DIAGNOSIS — M545 Low back pain, unspecified: Secondary | ICD-10-CM | POA: Diagnosis not present

## 2020-05-08 DIAGNOSIS — M5432 Sciatica, left side: Secondary | ICD-10-CM

## 2020-05-08 IMAGING — DX DG LUMBAR SPINE COMPLETE 4+V
5 series · 5 of 5 positions shown · non-contrast
Comparison: None.

CLINICAL DATA: Lumbosacral back pain. Left-sided sciatica for 1
month. Left lumbar radiculopathy.

EXAM:
LUMBAR SPINE - COMPLETE 4+ VIEW

[l-spine ap]
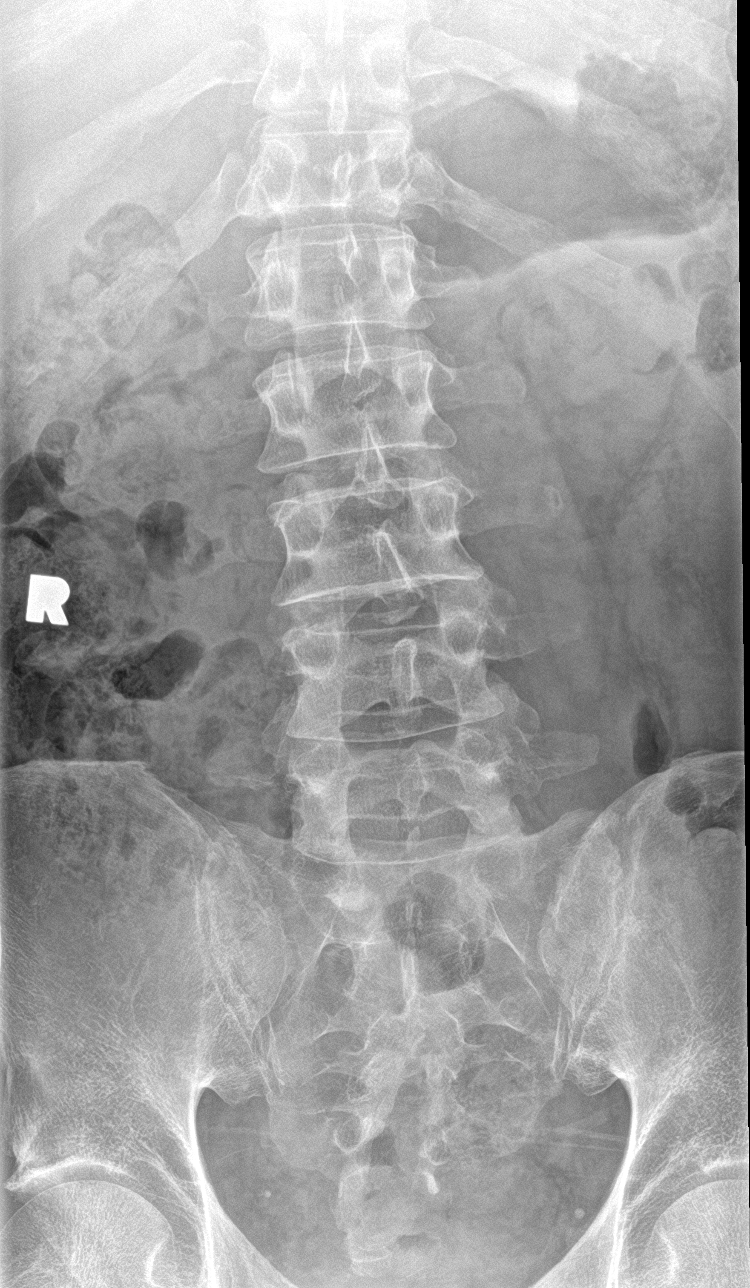

[l-spine obl (1 of 2)]
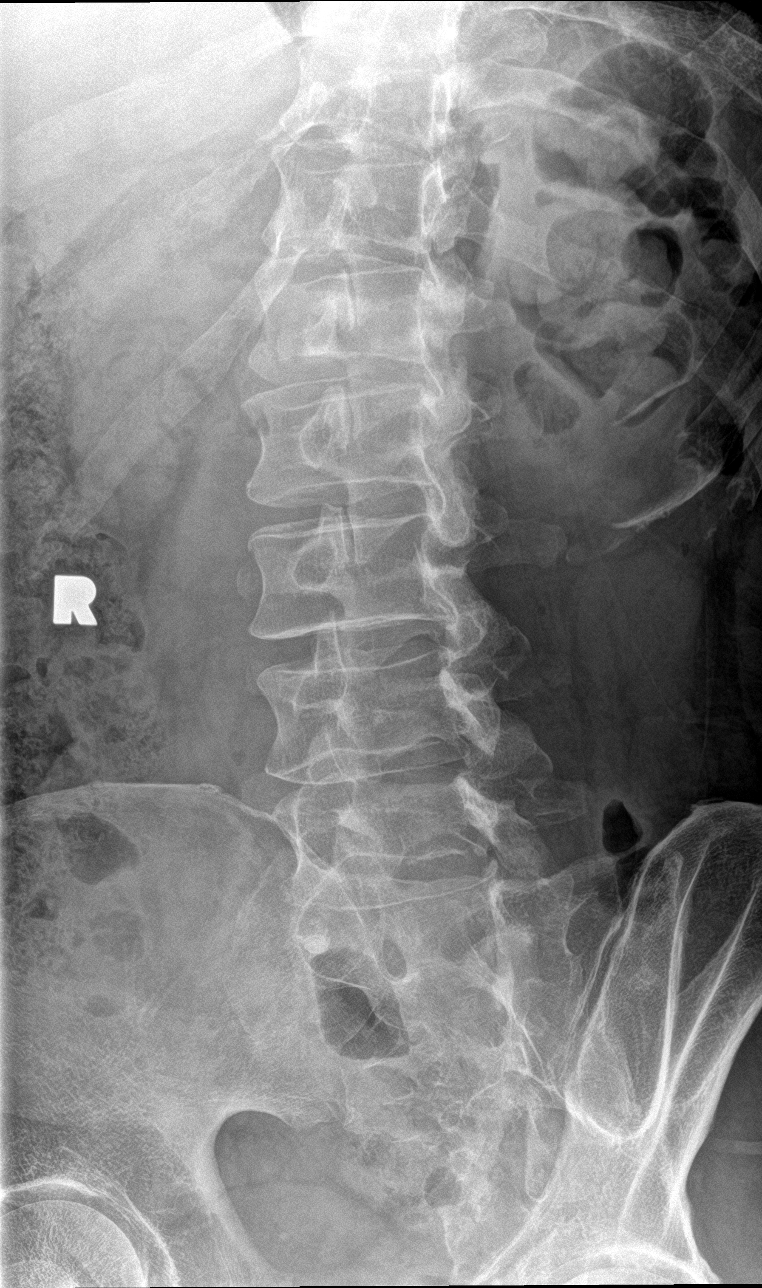

[l-spine obl (2 of 2)]
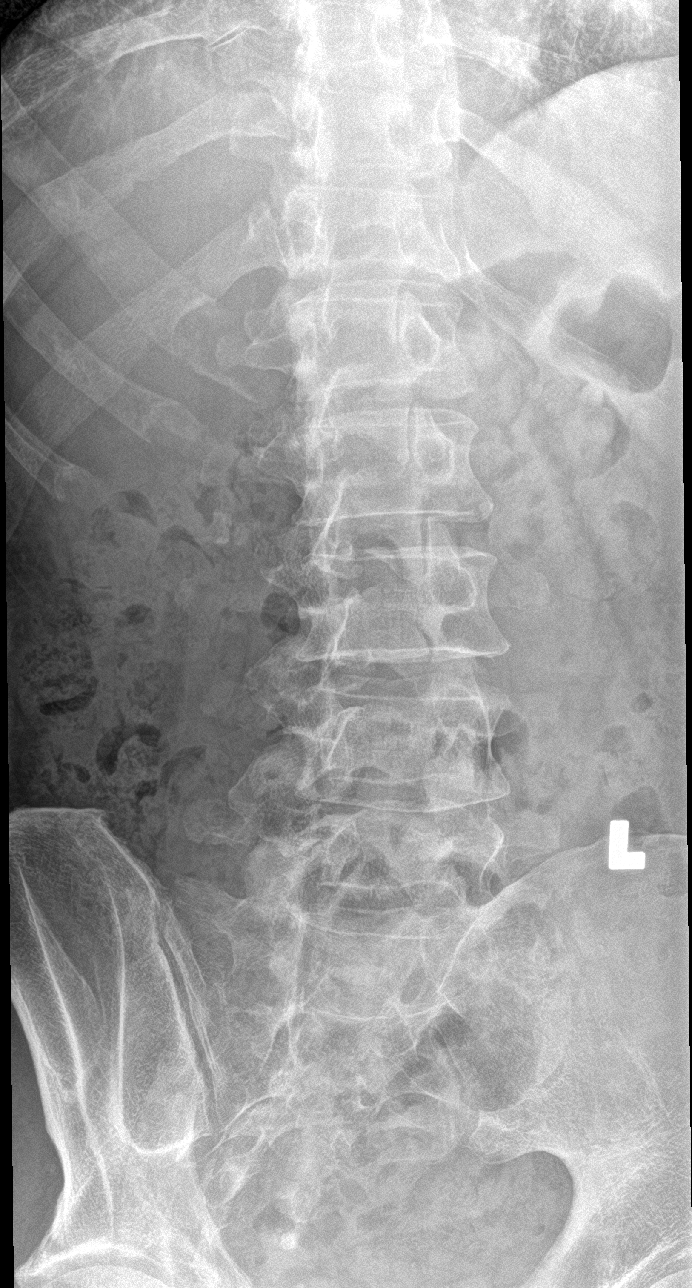

[l-spine lat (1 of 2)]
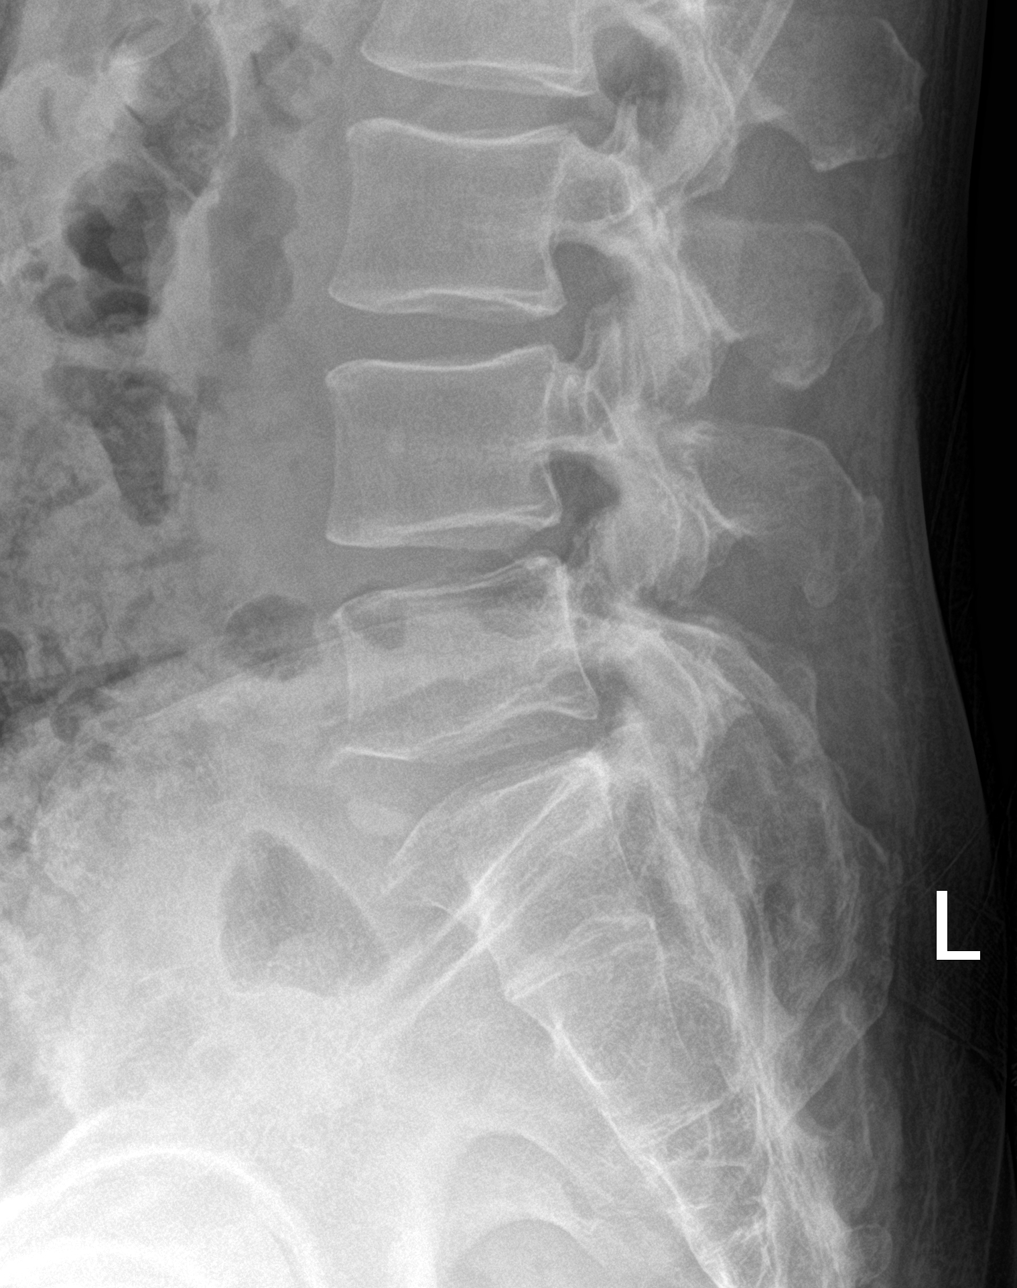

[l-spine lat (2 of 2)]
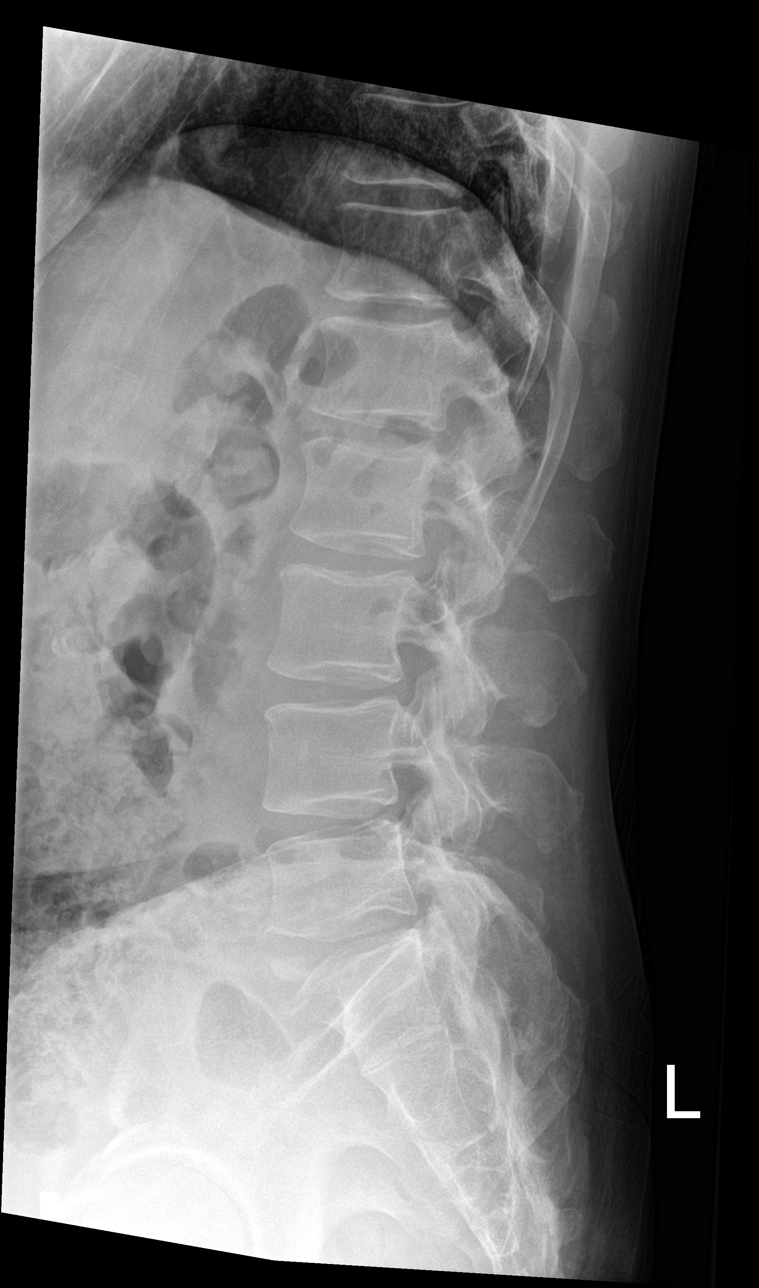

[5 of 5 positions shown; findings below may reference images not displayed]

FINDINGS: Five non-rib-bearing lumbar vertebra. Slight straightening of normal
lordosis. Mild broad-based dextroscoliotic curvature. No listhesis.
Vertebral body heights are normal. Minimal endplate spurring
throughout the lumbar spine with preservation of disc spaces. No
fracture, pars defects, or focal bone abnormality. Mild degenerative
change of the sacroiliac joints are congruent.
IMPRESSION: 1. Mild broad-based dextroscoliotic curvature and spondylosis with
multilevel endplate spurring.
2. Mild degenerative change of the sacroiliac joints.

## 2020-05-08 MED ORDER — PREDNISONE 50 MG PO TABS: ORAL_TABLET | ORAL | 0 refills | Status: DC

## 2020-05-08 NOTE — Progress Notes (Signed)
    Procedures performed today:    None.  Independent interpretation of notes and tests performed by another provider:   None.  Brief History, Exam, Impression, and Recommendations:    Left lumbar radiculopathy This is a very pleasant 60 year old male, he has been playing golf and doing Pilates. Unfortunately for the past month he has had pain in his back worse with sitting, flexion, Valsalva with radiation down to his left calf, no red flag symptoms, we discussed the anatomy and evolutionary anthropology of disc disease, adding 5 days of prednisone, formal physical therapy, x-rays, return to see me in 6 weeks, MRI for interventional planning if no better.    ___________________________________________ [name]. Benjamin Stain, M.D., ABFM., CAQSM. Primary Care and Sports Medicine [place] [facility]  Adjunct Instructor of Family Medicine  [employer]

## 2020-05-08 NOTE — Assessment & Plan Note (Signed)
This is a very pleasant 60 year old male, he has been playing golf and doing Pilates. Unfortunately for the past month he has had pain in his back worse with sitting, flexion, Valsalva with radiation down to his left calf, no red flag symptoms, we discussed the anatomy and evolutionary anthropology of disc disease, adding 5 days of prednisone, formal physical therapy, x-rays, return to see me in 6 weeks, MRI for interventional planning if no better.

## 2020-05-16 DIAGNOSIS — M5416 Radiculopathy, lumbar region: Secondary | ICD-10-CM | POA: Diagnosis not present

## 2020-05-16 DIAGNOSIS — M4306 Spondylolysis, lumbar region: Secondary | ICD-10-CM | POA: Diagnosis not present

## 2020-05-19 DIAGNOSIS — M5416 Radiculopathy, lumbar region: Secondary | ICD-10-CM | POA: Diagnosis not present

## 2020-05-19 DIAGNOSIS — M4306 Spondylolysis, lumbar region: Secondary | ICD-10-CM | POA: Diagnosis not present

## 2020-05-23 DIAGNOSIS — M5416 Radiculopathy, lumbar region: Secondary | ICD-10-CM | POA: Diagnosis not present

## 2020-05-23 DIAGNOSIS — M4306 Spondylolysis, lumbar region: Secondary | ICD-10-CM | POA: Diagnosis not present

## 2020-05-26 DIAGNOSIS — M4306 Spondylolysis, lumbar region: Secondary | ICD-10-CM | POA: Diagnosis not present

## 2020-05-26 DIAGNOSIS — M5416 Radiculopathy, lumbar region: Secondary | ICD-10-CM | POA: Diagnosis not present

## 2020-06-06 DIAGNOSIS — M4306 Spondylolysis, lumbar region: Secondary | ICD-10-CM | POA: Diagnosis not present

## 2020-06-06 DIAGNOSIS — M5416 Radiculopathy, lumbar region: Secondary | ICD-10-CM | POA: Diagnosis not present

## 2020-06-09 DIAGNOSIS — M4306 Spondylolysis, lumbar region: Secondary | ICD-10-CM | POA: Diagnosis not present

## 2020-06-09 DIAGNOSIS — M5416 Radiculopathy, lumbar region: Secondary | ICD-10-CM | POA: Diagnosis not present

## 2020-06-12 DIAGNOSIS — M4306 Spondylolysis, lumbar region: Secondary | ICD-10-CM | POA: Diagnosis not present

## 2020-06-12 DIAGNOSIS — M5416 Radiculopathy, lumbar region: Secondary | ICD-10-CM | POA: Diagnosis not present

## 2020-06-15 DIAGNOSIS — M5416 Radiculopathy, lumbar region: Secondary | ICD-10-CM | POA: Diagnosis not present

## 2020-06-15 DIAGNOSIS — M4306 Spondylolysis, lumbar region: Secondary | ICD-10-CM | POA: Diagnosis not present

## 2020-06-19 ENCOUNTER — Ambulatory Visit (INDEPENDENT_AMBULATORY_CARE_PROVIDER_SITE_OTHER): Payer: BC Managed Care – PPO | Admitting: Sports Medicine

## 2020-06-19 ENCOUNTER — Other Ambulatory Visit: Payer: Self-pay

## 2020-06-19 DIAGNOSIS — G8929 Other chronic pain: Secondary | ICD-10-CM

## 2020-06-19 DIAGNOSIS — M5416 Radiculopathy, lumbar region: Secondary | ICD-10-CM | POA: Diagnosis not present

## 2020-06-19 DIAGNOSIS — M25512 Pain in left shoulder: Secondary | ICD-10-CM | POA: Diagnosis not present

## 2020-06-19 DIAGNOSIS — M25511 Pain in right shoulder: Secondary | ICD-10-CM

## 2020-06-19 NOTE — Assessment & Plan Note (Signed)
T has done approximately 4 to 6 weeks of physical therapy now, he does feel a lot better, still gets occasional paresthesias into both legs in an L5 distribution. As he continues to improve and PT is recommending at least 3 more weeks, I think we should stay the course, return to see me in about 4 weeks, MRI for interventional planning if no better.

## 2020-06-19 NOTE — Assessment & Plan Note (Signed)
Ernest Nguyen is having a recurrence of right shoulder pain, approximately 4 months ago we did right subacromial and glenohumeral injections and he had only short-term relief. Showed degenerative changes. At this point he has had greater than 6 weeks of conservative treatment, started February 25, 2020. Proceeding with MRI of the right shoulder. I am going to add some conditioning exercises. Return to see me in 4 weeks.

## 2020-06-19 NOTE — Progress Notes (Signed)
    Procedures performed today:    None.  Independent interpretation of notes and tests performed by another provider:   None.  Brief History, Exam, Impression, and Recommendations:    Bilateral shoulder pain Ernest Nguyen is having a recurrence of right shoulder pain, approximately 4 months ago we did right subacromial and glenohumeral injections and he had only short-term relief. Showed degenerative changes. At this point he has had greater than 6 weeks of conservative treatment, started February 25, 2020. Proceeding with MRI of the right shoulder. I am going to add some conditioning exercises. Return to see me in 4 weeks.  Left lumbar radiculopathy T has done approximately 4 to 6 weeks of physical therapy now, he does feel a lot better, still gets occasional paresthesias into both legs in an L5 distribution. As he continues to improve and PT is recommending at least 3 more weeks, I think we should stay the course, return to see me in about 4 weeks, MRI for interventional planning if no better.    ___________________________________________ Ernest Nguyen. Ernest Nguyen, M.D., ABFM., CAQSM. Primary Care and Sports Medicine [place] [facility]  Adjunct Instructor of Family Medicine  [employer]

## 2020-06-20 DIAGNOSIS — M4306 Spondylolysis, lumbar region: Secondary | ICD-10-CM | POA: Diagnosis not present

## 2020-06-20 DIAGNOSIS — M5416 Radiculopathy, lumbar region: Secondary | ICD-10-CM | POA: Diagnosis not present

## 2020-06-29 DIAGNOSIS — M5416 Radiculopathy, lumbar region: Secondary | ICD-10-CM | POA: Diagnosis not present

## 2020-06-29 DIAGNOSIS — M4306 Spondylolysis, lumbar region: Secondary | ICD-10-CM | POA: Diagnosis not present

## 2020-07-02 ENCOUNTER — Other Ambulatory Visit: Payer: Self-pay

## 2020-07-02 ENCOUNTER — Ambulatory Visit (INDEPENDENT_AMBULATORY_CARE_PROVIDER_SITE_OTHER): Payer: BC Managed Care – PPO

## 2020-07-02 DIAGNOSIS — M25511 Pain in right shoulder: Secondary | ICD-10-CM

## 2020-07-02 DIAGNOSIS — G8929 Other chronic pain: Secondary | ICD-10-CM | POA: Diagnosis not present

## 2020-07-02 IMAGING — MR MR SHOULDER*R* W/O CM
5 series · 40 of 40 positions shown · non-contrast
Comparison: None.

CLINICAL DATA: Right shoulder pain for 6 months.  No known injury.

EXAM:
MRI OF THE RIGHT SHOULDER WITHOUT CONTRAST
TECHNIQUE: Multiplanar, multisequence MR imaging of the shoulder was performed.
No intravenous contrast was administered.

[Series 6: T2 fat-sat · axial · 4.0mm · 0.62mm/px · z∈[-29,+76]mm · 8 of 25 slices shown (1 of 3)]
[im 1/25]
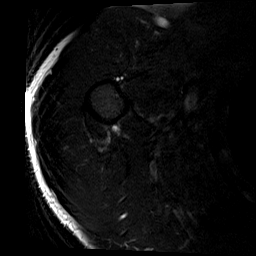
[im 4/25]
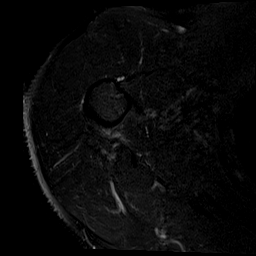
[im 7/25]
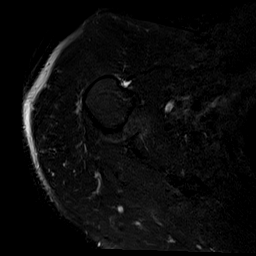
[im 11/25]
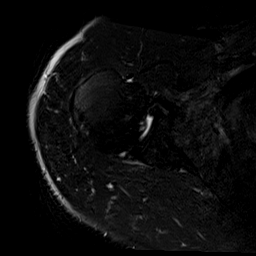
[im 14/25]
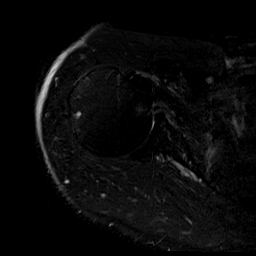
[im 18/25]
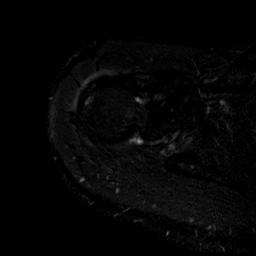
[im 21/25]
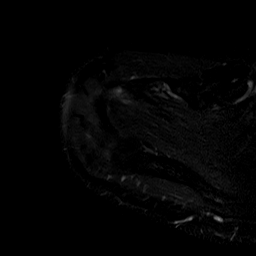
[im 25/25]
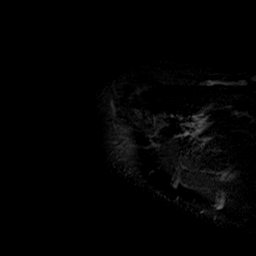

[Series 9: PD · coronal · 4.0mm · 0.62mm/px · 8 of 21 slices shown]
[im 1/21]
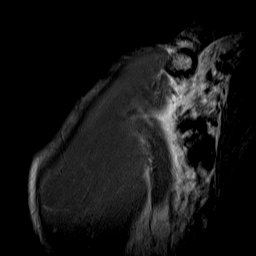
[im 3/21]
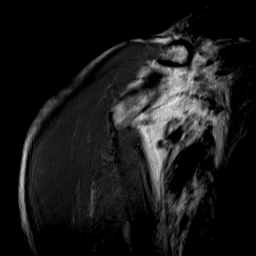
[im 6/21]
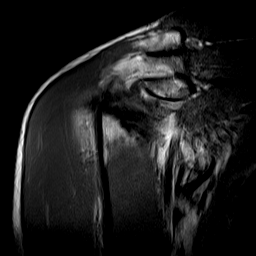
[im 9/21]
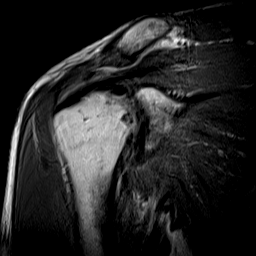
[im 12/21]
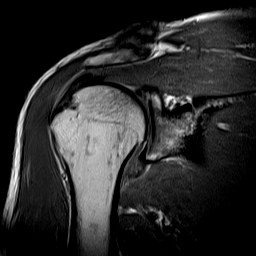
[im 15/21]
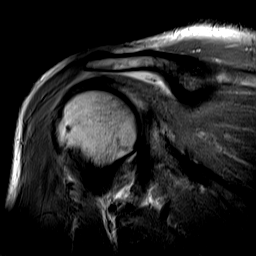
[im 18/21]
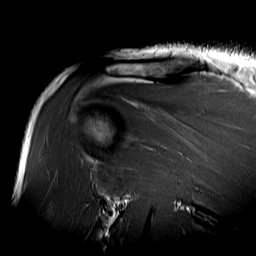
[im 21/21]
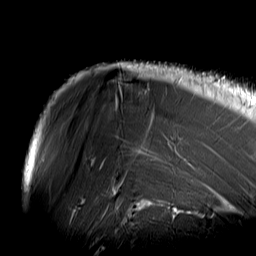

[Series 10: T1 · oblique · 4.0mm · 0.59mm/px · 8 of 22 slices shown]
[im 1/22]
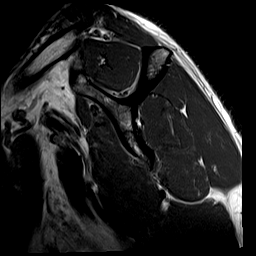
[im 4/22]
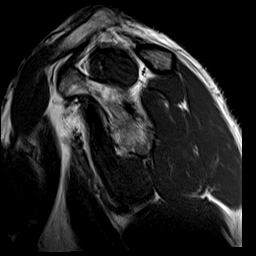
[im 7/22]
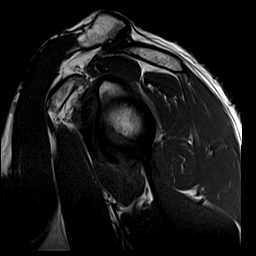
[im 10/22]
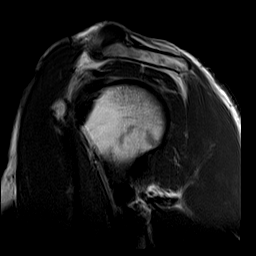
[im 13/22]
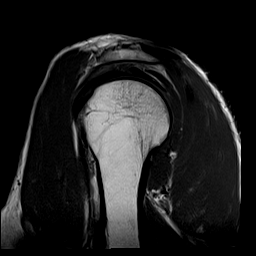
[im 16/22]
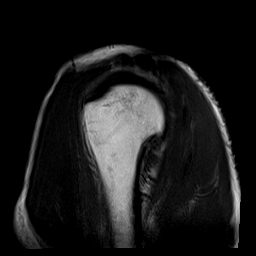
[im 19/22]
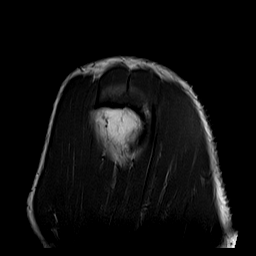
[im 22/22]
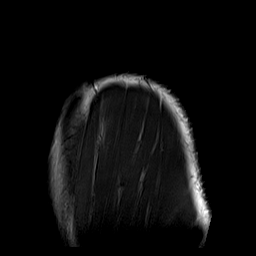

[Series 11: T2 fat-sat · oblique · 4.0mm · 0.55mm/px · 8 of 22 slices shown (2 of 3)]
[im 1/22]
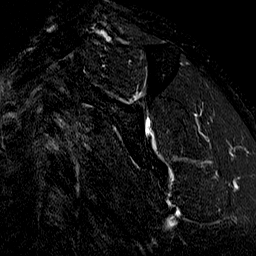
[im 4/22]
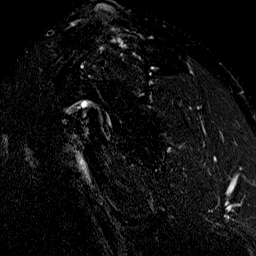
[im 7/22]
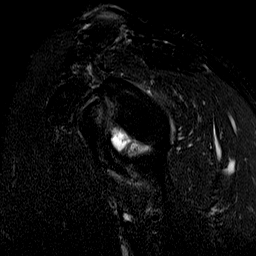
[im 10/22]
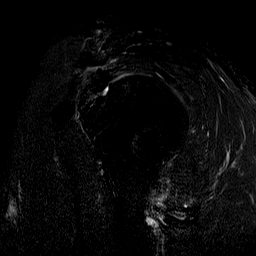
[im 13/22]
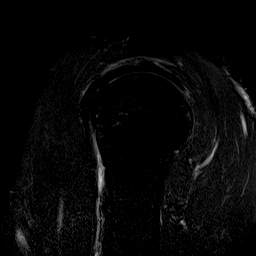
[im 16/22]
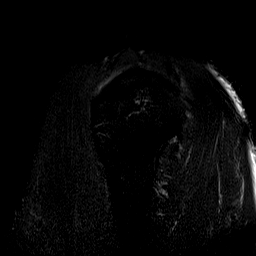
[im 19/22]
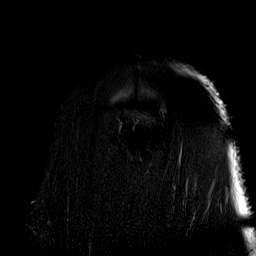
[im 22/22]
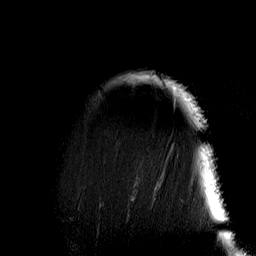

[Series 12: T2 fat-sat · coronal · 4.0mm · 0.62mm/px · 8 of 21 slices shown (3 of 3)]
[im 1/21]
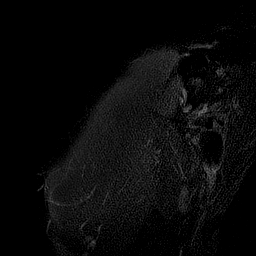
[im 3/21]
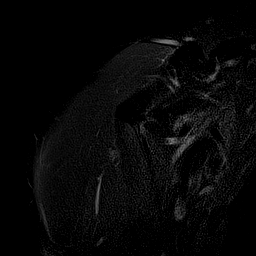
[im 6/21]
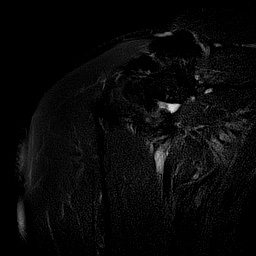
[im 9/21]
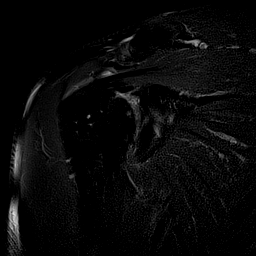
[im 12/21]
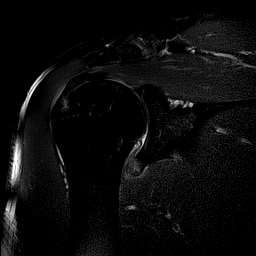
[im 15/21]
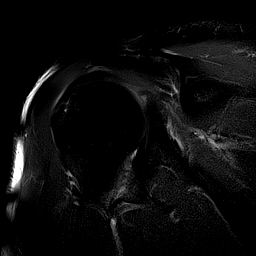
[im 18/21]
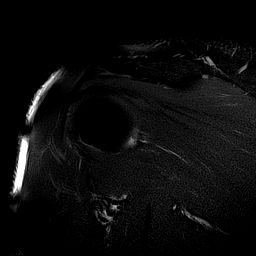
[im 21/21]
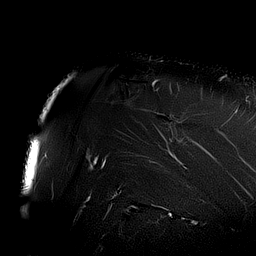

[40 of 40 positions shown; findings below may reference images not displayed]

FINDINGS: Rotator cuff: Mild tendinosis of the supraspinatus tendon with a
tiny insertional interstitial tear and fraying along the articular
surface. Infraspinatus tendon is intact. Teres minor tendon is
intact. Subscapularis tendon is intact.

Muscles: No muscle atrophy or edema. No intramuscular fluid
collection or hematoma.

Biceps Long Head: Intraarticular and extraarticular portions of the
biceps tendon are intact.

Acromioclavicular Joint: Severe arthropathy of the acromioclavicular
joint. Type I acromion. Trace subacromial/subdeltoid bursal fluid.

Glenohumeral Joint: No joint effusion. Mild partial-thickness
cartilage loss of the glenohumeral joint. Thickening and
intermediate signal of the inferior joint capsule as can be seen
with adhesive capsulitis.

Labrum: Grossly intact, but evaluation is limited by lack of
intraarticular fluid/contrast.

Bones: No fracture or dislocation. No aggressive osseous lesion.

Other: No fluid collection or hematoma.
IMPRESSION: 1. Mild tendinosis of the supraspinatus tendon with a tiny
insertional interstitial tear and fraying along the articular
surface.
2. Mild partial-thickness cartilage loss of the glenohumeral joint.
3. Thickening and intermediate signal of the inferior joint capsule
as can be seen with adhesive capsulitis.

## 2020-07-04 ENCOUNTER — Encounter: Payer: Self-pay | Admitting: Sports Medicine

## 2020-07-04 DIAGNOSIS — M4306 Spondylolysis, lumbar region: Secondary | ICD-10-CM | POA: Diagnosis not present

## 2020-07-04 DIAGNOSIS — M5416 Radiculopathy, lumbar region: Secondary | ICD-10-CM | POA: Diagnosis not present

## 2020-07-04 NOTE — Telephone Encounter (Signed)
This encounter was created in error - please disregard.

## 2020-07-18 ENCOUNTER — Other Ambulatory Visit: Payer: Self-pay

## 2020-07-18 ENCOUNTER — Ambulatory Visit (INDEPENDENT_AMBULATORY_CARE_PROVIDER_SITE_OTHER): Payer: BC Managed Care – PPO | Admitting: Sports Medicine

## 2020-07-18 ENCOUNTER — Ambulatory Visit (INDEPENDENT_AMBULATORY_CARE_PROVIDER_SITE_OTHER): Payer: BC Managed Care – PPO

## 2020-07-18 DIAGNOSIS — M25512 Pain in left shoulder: Secondary | ICD-10-CM

## 2020-07-18 DIAGNOSIS — G8929 Other chronic pain: Secondary | ICD-10-CM | POA: Diagnosis not present

## 2020-07-18 DIAGNOSIS — M25511 Pain in right shoulder: Secondary | ICD-10-CM

## 2020-07-18 NOTE — Progress Notes (Signed)
    Procedures performed today:    Procedure: Real-time Ultrasound Guided injection of the right glenohumeral joint Device: Samsung HS60  Verbal informed consent obtained.  Time-out conducted.  Noted no overlying erythema, induration, or other signs of local infection.  Skin prepped in a sterile fashion.  Local anesthesia: Topical Ethyl chloride.  With sterile technique and under real time ultrasound guidance: Noted arthritic joint, 1 cc Kenalog 40, 2 cc lidocaine, 2 cc bupivacaine injected easily Completed without difficulty  Advised to call if fevers/chills, erythema, induration, drainage, or persistent bleeding.  Images permanently stored and available for review in PACS.  Impression: Technically successful ultrasound guided injection.  Independent interpretation of notes and tests performed by another provider:   None.  Brief History, Exam, Impression, and Recommendations:    Bilateral shoulder pain Recurrence of right shoulder pain, we last did subacromial and glenohumeral injections approximately 5 months ago, he had short-term relief, today pain is mostly referable to the glenohumeral joint, MRI was done that showed degenerative changes in the glenohumeral joint as well as some rotator cuff tearing. Per his request we did a repeat right glenohumeral injection, return to see me as needed. He does understand the neck step is consultation with shoulder surgery.  There is a chronic process with exacerbation and pharmacologic intervention.    ___________________________________________ [name]. Benjamin Stain, M.D., ABFM., CAQSM. Primary Care and Sports Medicine [facility]  Adjunct Instructor of Family Medicine  [employer]

## 2020-07-18 NOTE — Assessment & Plan Note (Signed)
Recurrence of right shoulder pain, we last did subacromial and glenohumeral injections approximately 5 months ago, he had short-term relief, today pain is mostly referable to the glenohumeral joint, MRI was done that showed degenerative changes in the glenohumeral joint as well as some rotator cuff tearing. Per his request we did a repeat right glenohumeral injection, return to see me as needed. He does understand the neck step is consultation with shoulder surgery.  There is a chronic process with exacerbation and pharmacologic intervention.

## 2020-10-17 ENCOUNTER — Ambulatory Visit: Payer: BC Managed Care – PPO | Admitting: Sports Medicine

## 2020-10-18 ENCOUNTER — Other Ambulatory Visit: Payer: Self-pay

## 2020-10-18 ENCOUNTER — Ambulatory Visit (INDEPENDENT_AMBULATORY_CARE_PROVIDER_SITE_OTHER): Payer: BC Managed Care – PPO | Admitting: Sports Medicine

## 2020-10-18 ENCOUNTER — Ambulatory Visit (INDEPENDENT_AMBULATORY_CARE_PROVIDER_SITE_OTHER): Payer: BC Managed Care – PPO

## 2020-10-18 ENCOUNTER — Ambulatory Visit: Payer: BC Managed Care – PPO | Admitting: Sports Medicine

## 2020-10-18 DIAGNOSIS — G8929 Other chronic pain: Secondary | ICD-10-CM | POA: Diagnosis not present

## 2020-10-18 DIAGNOSIS — M25512 Pain in left shoulder: Secondary | ICD-10-CM | POA: Diagnosis not present

## 2020-10-18 DIAGNOSIS — M25511 Pain in right shoulder: Secondary | ICD-10-CM

## 2020-10-18 DIAGNOSIS — N138 Other obstructive and reflux uropathy: Secondary | ICD-10-CM

## 2020-10-18 DIAGNOSIS — N401 Enlarged prostate with lower urinary tract symptoms: Secondary | ICD-10-CM | POA: Diagnosis not present

## 2020-10-18 MED ORDER — FINASTERIDE 5 MG PO TABS: 5.0000 mg | ORAL_TABLET | Freq: Every day | ORAL | 3 refills | Status: DC

## 2020-10-18 NOTE — Progress Notes (Signed)
    Procedures performed today:    Procedure: Real-time Ultrasound Guided injection of the right glenohumeral joint Device: Samsung HS60  Verbal informed consent obtained.  Time-out conducted.  Noted no overlying erythema, induration, or other signs of local infection.  Skin prepped in a sterile fashion.  Local anesthesia: Topical Ethyl chloride.  With sterile technique and under real time ultrasound guidance: Noted minimally arthritic joint, 22-gauge spinal needle advanced from posterior aspect into the joint, 1 cc Kenalog 40, 2 cc lidocaine, 2 cc bupivacaine injected easily Completed without difficulty  Advised to call if fevers/chills, erythema, induration, drainage, or persistent bleeding.  Images permanently stored and available for review in PACS.  Impression: Technically successful ultrasound guided injection.  Independent interpretation of notes and tests performed by another provider:   None.  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 Repeat injection today for recurrence of pain. He is going to make an appoint with Dr. Everardo Pacific to discuss surgical options. In the future we could certainly consider trying to get viscosupplementation approved for her shoulder and/or PRP.  BPH with obstruction/lower urinary tract symptoms T returns, he is a pleasant 60 year old male, he has tried double dose Flomax as well as tadalafil all without significant improvement in his 3-4 times nocturia. His sleep hygiene is adequate, empties his bladder before bedtime and does not drink too much before going to bed. At this point we will try 5 alpha reductase inhibitor, finasteride, patient understands the this could take a year, I would  also like consultation from urology.    ___________________________________________ [name]. Benjamin Stain, M.D., ABFM., CAQSM. Primary Care and Sports Medicine [facility]  Adjunct Instructor of Family Medicine  [employer]

## 2020-10-18 NOTE — Assessment & Plan Note (Signed)
T returns, he is a pleasant 60 year old male, he has tried double dose Flomax as well as tadalafil all without significant improvement in his 3-4 times nocturia. His sleep hygiene is adequate, empties his bladder before bedtime and does not drink too much before going to bed. At this point we will try 5 alpha reductase inhibitor, finasteride, patient understands the this could take a year, I would also like consultation from urology.

## 2020-10-18 NOTE — Assessment & Plan Note (Signed)
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 Repeat injection today for recurrence of pain. He is going to make an appoint with Dr. Everardo Pacific to discuss surgical options. In the future we could certainly consider trying to get viscosupplementation approved for her shoulder and/or PRP.

## 2020-10-23 ENCOUNTER — Other Ambulatory Visit: Payer: Self-pay | Admitting: Sports Medicine

## 2020-10-23 DIAGNOSIS — N401 Enlarged prostate with lower urinary tract symptoms: Secondary | ICD-10-CM

## 2020-10-23 DIAGNOSIS — E785 Hyperlipidemia, unspecified: Secondary | ICD-10-CM

## 2020-10-23 DIAGNOSIS — N138 Other obstructive and reflux uropathy: Secondary | ICD-10-CM

## 2020-11-03 ENCOUNTER — Other Ambulatory Visit: Payer: Self-pay | Admitting: Sports Medicine

## 2020-11-03 DIAGNOSIS — G4700 Insomnia, unspecified: Secondary | ICD-10-CM

## 2020-11-16 DIAGNOSIS — R350 Frequency of micturition: Secondary | ICD-10-CM | POA: Diagnosis not present

## 2020-11-21 DIAGNOSIS — M25511 Pain in right shoulder: Secondary | ICD-10-CM | POA: Diagnosis not present

## 2020-11-22 DIAGNOSIS — M25511 Pain in right shoulder: Secondary | ICD-10-CM | POA: Diagnosis not present

## 2020-11-29 DIAGNOSIS — M25511 Pain in right shoulder: Secondary | ICD-10-CM | POA: Diagnosis not present

## 2020-12-01 DIAGNOSIS — M25511 Pain in right shoulder: Secondary | ICD-10-CM | POA: Diagnosis not present

## 2020-12-04 DIAGNOSIS — M25511 Pain in right shoulder: Secondary | ICD-10-CM | POA: Diagnosis not present

## 2020-12-08 ENCOUNTER — Other Ambulatory Visit: Payer: Self-pay | Admitting: Sports Medicine

## 2020-12-08 DIAGNOSIS — N401 Enlarged prostate with lower urinary tract symptoms: Secondary | ICD-10-CM

## 2020-12-08 DIAGNOSIS — N138 Other obstructive and reflux uropathy: Secondary | ICD-10-CM

## 2020-12-13 DIAGNOSIS — M25511 Pain in right shoulder: Secondary | ICD-10-CM | POA: Diagnosis not present

## 2020-12-18 DIAGNOSIS — M25511 Pain in right shoulder: Secondary | ICD-10-CM | POA: Diagnosis not present

## 2020-12-18 DIAGNOSIS — R350 Frequency of micturition: Secondary | ICD-10-CM | POA: Diagnosis not present

## 2020-12-21 DIAGNOSIS — M25511 Pain in right shoulder: Secondary | ICD-10-CM | POA: Diagnosis not present

## 2020-12-25 DIAGNOSIS — M25511 Pain in right shoulder: Secondary | ICD-10-CM | POA: Diagnosis not present

## 2021-01-03 DIAGNOSIS — M25511 Pain in right shoulder: Secondary | ICD-10-CM | POA: Diagnosis not present

## 2021-01-04 DIAGNOSIS — M25511 Pain in right shoulder: Secondary | ICD-10-CM | POA: Diagnosis not present

## 2021-01-08 ENCOUNTER — Other Ambulatory Visit: Payer: Self-pay | Admitting: Sports Medicine

## 2021-01-08 DIAGNOSIS — N401 Enlarged prostate with lower urinary tract symptoms: Secondary | ICD-10-CM

## 2021-01-08 DIAGNOSIS — N138 Other obstructive and reflux uropathy: Secondary | ICD-10-CM

## 2021-01-22 ENCOUNTER — Other Ambulatory Visit: Payer: Self-pay | Admitting: Sports Medicine

## 2021-01-22 DIAGNOSIS — E785 Hyperlipidemia, unspecified: Secondary | ICD-10-CM

## 2021-03-13 DIAGNOSIS — E785 Hyperlipidemia, unspecified: Secondary | ICD-10-CM | POA: Diagnosis not present

## 2021-03-13 DIAGNOSIS — N138 Other obstructive and reflux uropathy: Secondary | ICD-10-CM | POA: Diagnosis not present

## 2021-03-13 DIAGNOSIS — N401 Enlarged prostate with lower urinary tract symptoms: Secondary | ICD-10-CM | POA: Diagnosis not present

## 2021-03-14 ENCOUNTER — Encounter: Payer: BC Managed Care – PPO | Admitting: Sports Medicine

## 2021-03-14 LAB — COMPREHENSIVE METABOLIC PANEL
AG Ratio: 1.7 (calc) (ref 1.0–2.5)
ALT: 26 U/L (ref 9–46)
AST: 27 U/L (ref 10–35)
Albumin: 4.5 g/dL (ref 3.6–5.1)
Alkaline phosphatase (APISO): 63 U/L (ref 35–144)
BUN: 12 mg/dL (ref 7–25)
CO2: 30 mmol/L (ref 20–32)
Calcium: 9.8 mg/dL (ref 8.6–10.3)
Chloride: 102 mmol/L (ref 98–110)
Creat: 1.11 mg/dL (ref 0.70–1.35)
Globulin: 2.6 g/dL (calc) (ref 1.9–3.7)
Glucose, Bld: 99 mg/dL (ref 65–99)
Potassium: 4.2 mmol/L (ref 3.5–5.3)
Sodium: 138 mmol/L (ref 135–146)
Total Bilirubin: 1.1 mg/dL (ref 0.2–1.2)
Total Protein: 7.1 g/dL (ref 6.1–8.1)

## 2021-03-14 LAB — CBC
HCT: 44.3 % (ref 38.5–50.0)
Hemoglobin: 14.7 g/dL (ref 13.2–17.1)
MCH: 30.3 pg (ref 27.0–33.0)
MCHC: 33.2 g/dL (ref 32.0–36.0)
MCV: 91.3 fL (ref 80.0–100.0)
MPV: 9.2 fL (ref 7.5–12.5)
Platelets: 228 10*3/uL (ref 140–400)
RBC: 4.85 10*6/uL (ref 4.20–5.80)
RDW: 12.5 % (ref 11.0–15.0)
WBC: 2.9 10*3/uL — ABNORMAL LOW (ref 3.8–10.8)

## 2021-03-14 LAB — LIPID PANEL
Cholesterol: 153 mg/dL (ref <200)
HDL: 62 mg/dL (ref >=40)
LDL Cholesterol (Calc): 77 mg/dL (calc)
Non-HDL Cholesterol (Calc): 91 mg/dL (calc) (ref <130)
Total CHOL/HDL Ratio: 2.5 (calc) (ref <5.0)
Triglycerides: 60 mg/dL (ref <150)

## 2021-03-14 LAB — PSA, TOTAL AND FREE
PSA, % Free: 17 % (calc) — ABNORMAL LOW (ref >25)
PSA, Free: 0.1 ng/mL
PSA, Total: 0.6 ng/mL (ref <=4.0)

## 2021-03-14 LAB — TSH: TSH: 1.21 mIU/L (ref 0.40–4.50)

## 2021-03-15 ENCOUNTER — Encounter: Payer: BC Managed Care – PPO | Admitting: Sports Medicine

## 2021-03-21 ENCOUNTER — Other Ambulatory Visit: Payer: Self-pay

## 2021-03-21 ENCOUNTER — Ambulatory Visit (INDEPENDENT_AMBULATORY_CARE_PROVIDER_SITE_OTHER): Payer: BC Managed Care – PPO | Admitting: Sports Medicine

## 2021-03-21 DIAGNOSIS — N401 Enlarged prostate with lower urinary tract symptoms: Secondary | ICD-10-CM

## 2021-03-21 DIAGNOSIS — N138 Other obstructive and reflux uropathy: Secondary | ICD-10-CM

## 2021-03-21 DIAGNOSIS — M25511 Pain in right shoulder: Secondary | ICD-10-CM

## 2021-03-21 DIAGNOSIS — G8929 Other chronic pain: Secondary | ICD-10-CM

## 2021-03-21 DIAGNOSIS — E785 Hyperlipidemia, unspecified: Secondary | ICD-10-CM | POA: Diagnosis not present

## 2021-03-21 DIAGNOSIS — M25512 Pain in left shoulder: Secondary | ICD-10-CM

## 2021-03-21 MED ORDER — TADALAFIL 5 MG PO TABS: 5.0000 mg | ORAL_TABLET | Freq: Every day | ORAL | 3 refills | Status: DC

## 2021-03-21 MED ORDER — TAMSULOSIN HCL 0.4 MG PO CAPS: 0.4000 mg | ORAL_CAPSULE | Freq: Every day | ORAL | 3 refills | Status: DC

## 2021-03-21 MED ORDER — ATORVASTATIN CALCIUM 10 MG PO TABS: 10.0000 mg | ORAL_TABLET | Freq: Every day | ORAL | 3 refills | Status: DC

## 2021-03-21 NOTE — Assessment & Plan Note (Signed)
Annual physical as above, up-to-date on screening measures, labs look good, return in a year.

## 2021-03-21 NOTE — Progress Notes (Signed)
°  Subjective:    CC: Annual Physical Exam  HPI:  This patient is here for their annual physical  I reviewed the past medical history, family history, social history, surgical history, and allergies today and no changes were needed.  Please see the problem list section below in epic for further details.  Past Medical History: No past medical history on file. Past Surgical History: No past surgical history on file. Social History: Social History   Socioeconomic History   Marital status: Married    Spouse name: Not on file   Number of children: Not on file   Years of education: Not on file   Highest education level: Not on file  Occupational History   Not on file  Tobacco Use   Smoking status: Never   Smokeless tobacco: Never  Substance and Sexual Activity   Alcohol use: Yes    Comment: daily   Drug use: No   Sexual activity: Not on file  Other Topics Concern   Not on file  Social History Narrative   Not on file   Social Determinants of Health   Financial Resource Strain: Not on file  Food Insecurity: Not on file  Transportation Needs: Not on file  Physical Activity: Not on file  Stress: Not on file  Social Connections: Not on file   Family History: Family History  Problem Relation Age of Onset   Stroke Mother    Alcohol abuse Father    Seizures Father    Allergies: No Known Allergies Medications: See med rec.  Review of Systems: No headache, visual changes, nausea, vomiting, diarrhea, constipation, dizziness, abdominal pain, skin rash, fevers, chills, night sweats, swollen lymph nodes, weight loss, chest pain, body aches, joint swelling, muscle aches, shortness of breath, mood changes, visual or auditory hallucinations.  Objective:    General: Well Developed, well nourished, and in no acute distress.  Neuro: Alert and oriented x3, extra-ocular muscles intact, sensation grossly intact. Cranial nerves II through XII are intact, motor, sensory, and  coordinative functions are all intact. HEENT: Normocephalic, atraumatic, pupils equal round reactive to light, neck supple, no masses, no lymphadenopathy, thyroid nonpalpable. Oropharynx, nasopharynx, external ear canals are unremarkable. Skin: Warm and dry, no rashes noted.  Cardiac: Regular rate and rhythm, no murmurs rubs or gallops.  Respiratory: Clear to auscultation bilaterally. Not using accessory muscles, speaking in full sentences.  Abdominal: Soft, nontender, nondistended, positive bowel sounds, no masses, no organomegaly.  Musculoskeletal: Shoulder, elbow, wrist, hip, knee, ankle stable, and with full range of motion.  Impression and Recommendations:    The patient was counselled, risk factors were discussed, anticipatory guidance given.  Annual physical exam Annual physical as above, up-to-date on screening measures, labs look good, return in a year.  Bilateral shoulder pain T does have bilateral shoulder pain, MRI historically showed degenerative changes in the glenohumeral joint, cuff tearing, last glenohumeral injection was in September 2022. He did discuss shoulder arthroscopy with Dr. Griffin Basil, ultimately he is not really having enough discomfort day or night to limit his activities or consider this for now, we can just watch it for now.   ___________________________________________ Gwen Her. Dianah Field, M.D., ABFM., CAQSM. Primary Care and Sports Medicine [facility]  Adjunct Professor of [place] [employer] [facility] [employer] Medicine

## 2021-03-21 NOTE — Assessment & Plan Note (Signed)
T does have bilateral shoulder pain, MRI historically showed degenerative changes in the glenohumeral joint, cuff tearing, last glenohumeral injection was in September 2022. He did discuss shoulder arthroscopy with Dr. Griffin Basil, ultimately he is not really having enough discomfort day or night to limit his activities or consider this for now, we can just watch it for now.

## 2022-02-11 ENCOUNTER — Encounter: Payer: Self-pay | Admitting: Sports Medicine

## 2022-02-11 ENCOUNTER — Ambulatory Visit: Payer: BC Managed Care – PPO | Admitting: Sports Medicine

## 2022-02-11 ENCOUNTER — Ambulatory Visit (INDEPENDENT_AMBULATORY_CARE_PROVIDER_SITE_OTHER): Payer: BC Managed Care – PPO

## 2022-02-11 VITALS — BP 141/80 | HR 70 | Wt 213.0 lb

## 2022-02-11 DIAGNOSIS — R2232 Localized swelling, mass and lump, left upper limb: Secondary | ICD-10-CM | POA: Diagnosis not present

## 2022-02-11 DIAGNOSIS — Z8719 Personal history of other diseases of the digestive system: Secondary | ICD-10-CM | POA: Diagnosis not present

## 2022-02-11 DIAGNOSIS — M778 Other enthesopathies, not elsewhere classified: Secondary | ICD-10-CM | POA: Diagnosis not present

## 2022-02-11 NOTE — Assessment & Plan Note (Addendum)
This is a very pleasant 62 year old male, he for the past several weeks, maybe a month has noted a minimally tender nonmovable mass left radial volar forearm No trauma, no changes in activity, no fevers, chills. On exam he has a 2 cm mass that feels to be deep to the muscle fascia, it does not feel to be subcutaneous. It is minimally tender, it is nonmovable and it does not feel well-defined. I am concerned that this is a sarcoma/malignancy, we will proceed with x-rays of the left forearm, ultrasound left forearm with the understanding that we will likely need an MRI with contrast of the left forearm.  Update: As expected ultrasound and x-rays were unrevealing, I am going to go ahead and order the MRI with contrast.   MRI shows findings more suggestive of a hemangioma or vascular malformation, this is not malignant, I would like him to touch base with vascular surgery for final discussion though I suspect this will just be observed for now.

## 2022-02-11 NOTE — Progress Notes (Addendum)
    Procedures performed today:    None.  Independent interpretation of notes and tests performed by another provider:   None.  Brief History, Exam, Impression, and Recommendations:    Mass of left forearm This is a very pleasant 62 year old male, he for the past several weeks, maybe a month has noted a minimally tender nonmovable mass left radial volar forearm No trauma, no changes in activity, no fevers, chills. On exam he has a 2 cm mass that feels to be deep to the muscle fascia, it does not feel to be subcutaneous. It is minimally tender, it is nonmovable and it does not feel well-defined. I am concerned that this is a sarcoma/malignancy, we will proceed with x-rays of the left forearm, ultrasound left forearm with the understanding that we will likely need an MRI with contrast of the left forearm.  Update: As expected ultrasound and x-rays were unrevealing, I am going to go ahead and order the MRI with contrast.   MRI shows findings more suggestive of a hemangioma or vascular malformation, this is not malignant, I would like him to touch base with vascular surgery for final discussion though I suspect this will just be observed for now.     ____________________________________________ Gwen Her. Dianah Field, M.D., ABFM., CAQSM., AME. Primary Care and Sports Medicine [facility]  Adjunct Professor of [place] [employer] [facility] of Medicine  Risk manager

## 2022-02-12 NOTE — Addendum Note (Signed)
Addended by: Silverio Decamp on: 02/12/2022 01:30 PM   Modules accepted: Orders

## 2022-02-28 ENCOUNTER — Ambulatory Visit: Payer: BC Managed Care – PPO | Admitting: Sports Medicine

## 2022-03-04 ENCOUNTER — Ambulatory Visit (INDEPENDENT_AMBULATORY_CARE_PROVIDER_SITE_OTHER): Payer: BC Managed Care – PPO

## 2022-03-04 DIAGNOSIS — D1800 Hemangioma unspecified site: Secondary | ICD-10-CM | POA: Diagnosis not present

## 2022-03-04 DIAGNOSIS — R2232 Localized swelling, mass and lump, left upper limb: Secondary | ICD-10-CM | POA: Diagnosis not present

## 2022-03-04 MED ORDER — GADOBUTROL 1 MMOL/ML IV SOLN
10.0000 mL | Freq: Once | INTRAVENOUS | Status: AC | PRN
  Administered 2022-03-04: 10 mL via INTRAVENOUS

## 2022-03-05 ENCOUNTER — Ambulatory Visit: Payer: BC Managed Care – PPO | Admitting: Sports Medicine

## 2022-03-05 DIAGNOSIS — R2232 Localized swelling, mass and lump, left upper limb: Secondary | ICD-10-CM

## 2022-03-05 DIAGNOSIS — E785 Hyperlipidemia, unspecified: Secondary | ICD-10-CM

## 2022-03-05 MED ORDER — ATORVASTATIN CALCIUM 10 MG PO TABS: 10.0000 mg | ORAL_TABLET | Freq: Every day | ORAL | 3 refills | Status: DC

## 2022-03-05 NOTE — Addendum Note (Signed)
Addended by: Silverio Decamp on: 03/05/2022 04:21 PM   Modules accepted: Orders

## 2022-03-05 NOTE — Assessment & Plan Note (Signed)
T is here for follow-up, at the last visit we noted a mass, minimally tender, nonmovable left radial volar forearm, he had no trauma, no changes activity, no systemic symptoms. The mass felt to be about 2 cm, deep to the muscle fascia. We proceeded with ultrasound and MRI with contrast, MRI with contrast showed what appeared to be more of a vascular malformation versus hemangioma. I discussed these results with him today, as I am not an expert in hemangiomas or vascular malformations I would like vascular surgery to weigh in. Thankfully the lesion has decreased significantly in size, he can return to see me as needed.

## 2022-03-05 NOTE — Addendum Note (Signed)
Addended by: Silverio Decamp on: 03/05/2022 12:14 PM   Modules accepted: Orders

## 2022-03-05 NOTE — Progress Notes (Signed)
    Procedures performed today:    None.  Independent interpretation of notes and tests performed by another provider:   None.  Brief History, Exam, Impression, and Recommendations:    Mass of left forearm T is here for follow-up, at the last visit we noted a mass, minimally tender, nonmovable left radial volar forearm, he had no trauma, no changes activity, no systemic symptoms. The mass felt to be about 2 cm, deep to the muscle fascia. We proceeded with ultrasound and MRI with contrast, MRI with contrast showed what appeared to be more of a vascular malformation versus hemangioma. I discussed these results with him today, as I am not an expert in hemangiomas or vascular malformations I would like vascular surgery to weigh in. Thankfully the lesion has decreased significantly in size, he can return to see me as needed.    ____________________________________________ Gwen Her. Dianah Field, M.D., ABFM., CAQSM., AME. Primary Care and Sports Medicine [facility]  Adjunct Professor of [place] [employer] [facility] of Medicine  Risk manager

## 2022-03-20 ENCOUNTER — Encounter: Payer: Self-pay | Admitting: Vascular Surgery

## 2022-03-20 ENCOUNTER — Ambulatory Visit: Payer: BC Managed Care – PPO | Admitting: Vascular Surgery

## 2022-03-20 VITALS — BP 142/88 | HR 66 | Temp 98.7°F | Resp 20 | Ht 72.0 in | Wt 200.8 lb

## 2022-03-20 DIAGNOSIS — Q278 Other specified congenital malformations of peripheral vascular system: Secondary | ICD-10-CM

## 2022-03-20 NOTE — Progress Notes (Signed)
Patient ID: Ernest Voncannon., male   DOB: 16-Dec-1960, 62 y.o.   MRN: [id number]  Reason for Consult: New Patient (Initial Visit)   Referred by Silverio Decamp,*  Subjective:     HPI:  Ernest Losito Brooke Bonito. is a 61 y.o. male was having some discomfort in his left forearm and had a palpable in the left arm.  He does have hyperlipidemia but no other medical issues has never had any vascular issues.  He remains very active is retired and plays golf frequently agrees [employer].  He is a lifelong non-smoker.  He states that since the MRI his arm symptoms have really reduced to 0 and he can really no longer palpate any mass in the arm to speak of.  Past Medical History:  Diagnosis Date   Hyperlipidemia    Family History  Problem Relation Age of Onset   Stroke Mother    Alcohol abuse Father    Seizures Father    History reviewed. No pertinent surgical history.  Short Social History:  Social History   Tobacco Use   Smoking status: Never   Smokeless tobacco: Never  Substance Use Topics   Alcohol use: Yes    Comment: daily    No Known Allergies  Current Outpatient Medications  Medication Sig Dispense Refill   atorvastatin (LIPITOR) 10 MG tablet Take 1 tablet (10 mg total) by mouth daily. 90 tablet 3   tadalafil (CIALIS) 5 MG tablet Take 1 tablet (5 mg total) by mouth daily. 90 tablet 3   tamsulosin (FLOMAX) 0.4 MG CAPS capsule Take 1 capsule (0.4 mg total) by mouth daily. 90 capsule 3   zolpidem (AMBIEN) 10 MG tablet Take 1 tablet by mouth at bedtime as needed for sleep 30 tablet 0   No current facility-administered medications for this visit.    Review of Systems  Constitutional:  Constitutional negative. HENT: HENT negative.  Eyes: Eyes negative.  Respiratory: Respiratory negative.  Cardiovascular: Cardiovascular negative.  GI: Gastrointestinal negative.  Musculoskeletal:       Left forearm mass Neurological: Neurological negative. Psychiatric: Psychiatric  negative.        Objective:  Objective   Vitals:   03/20/22 1140  BP: (!) 142/88  Pulse: 66  Resp: 20  Temp: 98.7 F (37.1 C)  SpO2: 97%  Weight: 200 lb 12.8 oz (91.1 kg)  Height: 6' (1.829 m)   Body mass index is 27.23 kg/m.  Physical Exam HENT:     Head: Normocephalic.     Nose: Nose normal.     Mouth/Throat:     Mouth: Mucous membranes are moist.  Eyes:     Pupils: Pupils are equal, round, and reactive to light.  Cardiovascular:     Rate and Rhythm: Normal rate.     Pulses: Normal pulses.  Abdominal:     General: Abdomen is flat.  Musculoskeletal:        General: Normal range of motion.     Cervical back: Normal range of motion and neck supple.     Comments: There is a small palpable abnormality without definitive borders at the site of the MRI mass consistent with vascular malformation  Skin:    General: Skin is warm.     Capillary Refill: Capillary refill takes less than 2 seconds.  Neurological:     General: No focal deficit present.     Mental Status: He is alert.  Psychiatric:        Mood and Affect: Mood normal.  Thought Content: Thought content normal.        Judgment: Judgment normal.     Data: MRI IMPRESSION: 1. Ill-defined area of signal abnormality within the subcutaneous soft tissues of the radial and volar soft tissues at the mid to distal left forearm measuring approximately 3.0 x 0.5 x 2.0 cm. Heterogeneous postcontrast enhancement and interspersed fat favor a soft tissue vascular malformation/hemangioma. 2. Otherwise unremarkable MRI of the left forearm.     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  Given that the patient has no symptoms and this appears to have resolved since MRI I would not recommend any  intervention.  Certainly if he has worsening symptoms I instructed him to call to be reevaluated.     Waynetta Sandy MD [facility]

## 2022-03-28 ENCOUNTER — Other Ambulatory Visit: Payer: Self-pay | Admitting: Sports Medicine

## 2022-03-28 ENCOUNTER — Encounter: Payer: Self-pay | Admitting: Sports Medicine

## 2022-03-28 DIAGNOSIS — G4700 Insomnia, unspecified: Secondary | ICD-10-CM

## 2022-03-28 DIAGNOSIS — N138 Other obstructive and reflux uropathy: Secondary | ICD-10-CM

## 2022-03-28 DIAGNOSIS — N401 Enlarged prostate with lower urinary tract symptoms: Secondary | ICD-10-CM

## 2022-03-28 MED ORDER — ZOLPIDEM TARTRATE 10 MG PO TABS: 10.0000 mg | ORAL_TABLET | Freq: Every evening | ORAL | 3 refills | Status: DC | PRN

## 2022-03-28 MED ORDER — ZOLPIDEM TARTRATE 10 MG PO TABS: 10.0000 mg | ORAL_TABLET | Freq: Every evening | ORAL | 0 refills | Status: DC | PRN

## 2022-03-28 MED ORDER — TAMSULOSIN HCL 0.4 MG PO CAPS: 0.4000 mg | ORAL_CAPSULE | Freq: Every day | ORAL | 3 refills | Status: DC

## 2022-03-28 MED ORDER — TADALAFIL 5 MG PO TABS: 5.0000 mg | ORAL_TABLET | Freq: Every day | ORAL | 3 refills | Status: DC

## 2022-03-28 NOTE — Addendum Note (Signed)
Addended by: Silverio Decamp on: 03/28/2022 12:25 PM   Modules accepted: Orders

## 2022-04-01 MED ORDER — ZOLPIDEM TARTRATE 10 MG PO TABS: 10.0000 mg | ORAL_TABLET | Freq: Every evening | ORAL | 0 refills | Status: DC | PRN

## 2022-04-15 MED ORDER — ZOLPIDEM TARTRATE 10 MG PO TABS: 10.0000 mg | ORAL_TABLET | Freq: Every evening | ORAL | 0 refills | Status: DC | PRN

## 2022-04-15 NOTE — Addendum Note (Signed)
Addended by: Silverio Decamp on: 04/15/2022 09:55 AM   Modules accepted: Orders

## 2022-04-15 NOTE — Addendum Note (Signed)
Addended by: Narda Rutherford on: 04/15/2022 09:45 AM   Modules accepted: Orders

## 2022-04-24 ENCOUNTER — Other Ambulatory Visit: Payer: Self-pay | Admitting: Sports Medicine

## 2022-04-24 DIAGNOSIS — N138 Other obstructive and reflux uropathy: Secondary | ICD-10-CM

## 2022-04-26 MED ORDER — ZOLPIDEM TARTRATE 10 MG PO TABS: 10.0000 mg | ORAL_TABLET | Freq: Every evening | ORAL | 0 refills | Status: DC | PRN

## 2022-04-26 MED ORDER — TADALAFIL 5 MG PO TABS: 5.0000 mg | ORAL_TABLET | Freq: Every day | ORAL | 3 refills | Status: DC

## 2022-04-26 NOTE — Addendum Note (Signed)
Addended by: Silverio Decamp on: 04/26/2022 09:45 AM   Modules accepted: Orders

## 2022-11-15 ENCOUNTER — Telehealth: Payer: Self-pay | Admitting: Sports Medicine

## 2022-11-15 DIAGNOSIS — E785 Hyperlipidemia, unspecified: Secondary | ICD-10-CM

## 2022-11-15 DIAGNOSIS — N138 Other obstructive and reflux uropathy: Secondary | ICD-10-CM

## 2022-11-15 NOTE — Telephone Encounter (Signed)
Orders placed.

## 2022-11-15 NOTE — Telephone Encounter (Signed)
Patient is requesting labs ordered prior to his physical appt which is scheduled for 12-10-22 please include PSA

## 2022-12-02 ENCOUNTER — Other Ambulatory Visit: Payer: Self-pay

## 2022-12-02 DIAGNOSIS — N138 Other obstructive and reflux uropathy: Secondary | ICD-10-CM

## 2022-12-02 DIAGNOSIS — N401 Enlarged prostate with lower urinary tract symptoms: Secondary | ICD-10-CM | POA: Diagnosis not present

## 2022-12-02 DIAGNOSIS — E785 Hyperlipidemia, unspecified: Secondary | ICD-10-CM

## 2022-12-03 LAB — COMPREHENSIVE METABOLIC PANEL
ALT: 18 [IU]/L (ref 0–44)
AST: 27 [IU]/L (ref 0–40)
Albumin: 4.6 g/dL (ref 3.9–4.9)
Alkaline Phosphatase: 78 [IU]/L (ref 44–121)
BUN/Creatinine Ratio: 10 (ref 10–24)
BUN: 11 mg/dL (ref 8–27)
Bilirubin Total: 0.7 mg/dL (ref 0.0–1.2)
CO2: 23 mmol/L (ref 20–29)
Calcium: 9.7 mg/dL (ref 8.6–10.2)
Chloride: 102 mmol/L (ref 96–106)
Creatinine, Ser: 1.14 mg/dL (ref 0.76–1.27)
Globulin, Total: 2.7 g/dL (ref 1.5–4.5)
Glucose: 94 mg/dL (ref 70–99)
Potassium: 4.4 mmol/L (ref 3.5–5.2)
Sodium: 141 mmol/L (ref 134–144)
Total Protein: 7.3 g/dL (ref 6.0–8.5)
eGFR: 72 mL/min/{1.73_m2} (ref >59)

## 2022-12-03 LAB — LIPID PANEL
Chol/HDL Ratio: 2.2 ratio (ref 0.0–5.0)
Cholesterol, Total: 167 mg/dL (ref 100–199)
HDL: 76 mg/dL (ref >39)
LDL Chol Calc (NIH): 79 mg/dL (ref 0–99)
Triglycerides: 62 mg/dL (ref 0–149)
VLDL Cholesterol Cal: 12 mg/dL (ref 5–40)

## 2022-12-03 LAB — CBC
Hematocrit: 46 % (ref 37.5–51.0)
Hemoglobin: 15.1 g/dL (ref 13.0–17.7)
MCH: 30.6 pg (ref 26.6–33.0)
MCHC: 32.8 g/dL (ref 31.5–35.7)
MCV: 93 fL (ref 79–97)
Platelets: 218 10*3/uL (ref 150–450)
RBC: 4.93 x10E6/uL (ref 4.14–5.80)
RDW: 12.4 % (ref 11.6–15.4)
WBC: 2.8 10*3/uL — ABNORMAL LOW (ref 3.4–10.8)

## 2022-12-03 LAB — HEMOGLOBIN A1C
Est. average glucose Bld gHb Est-mCnc: 114 mg/dL
Hgb A1c MFr Bld: 5.6 % (ref 4.8–5.6)

## 2022-12-03 LAB — TSH: TSH: 1.37 u[IU]/mL (ref 0.450–4.500)

## 2022-12-03 LAB — PSA, TOTAL AND FREE
PSA, Free Pct: 16 %
PSA, Free: 0.16 ng/mL
Prostate Specific Ag, Serum: 1 ng/mL (ref 0.0–4.0)

## 2022-12-10 ENCOUNTER — Ambulatory Visit (INDEPENDENT_AMBULATORY_CARE_PROVIDER_SITE_OTHER): Payer: BC Managed Care – PPO | Admitting: Sports Medicine

## 2022-12-10 ENCOUNTER — Encounter: Payer: Self-pay | Admitting: Sports Medicine

## 2022-12-10 VITALS — BP 143/84 | HR 89 | Ht 72.0 in | Wt 197.0 lb

## 2022-12-10 DIAGNOSIS — Z0000 Encounter for general adult medical examination without abnormal findings: Secondary | ICD-10-CM | POA: Diagnosis not present

## 2022-12-10 NOTE — Assessment & Plan Note (Signed)
Physical as above, he will return for flu, tetanus, COVID vaccines. He will do a nurse visit and spread them out. He does not want to do them today because he has a trip coming up. He was concerned about his hemoglobin A1c, it is normal but it is 5.6%, we discussed the normal age-related increase in insulin resistance and low carbohydrate diet but no other changes are needed at this time. Return to see me in a year.

## 2022-12-10 NOTE — Progress Notes (Signed)
Subjective:    CC: Annual Physical Exam  HPI:  This patient is here for their annual physical  I reviewed the past medical history, family history, social history, surgical history, and allergies today and no changes were needed.  Please see the problem list section below in epic for further details.  Past Medical History: Past Medical History:  Diagnosis Date   Hyperlipidemia    Past Surgical History: No past surgical history on file. Social History: Social History   Socioeconomic History   Marital status: Married    Spouse name: Not on file   Number of children: Not on file   Years of education: Not on file   Highest education level: Not on file  Occupational History   Not on file  Tobacco Use   Smoking status: Never   Smokeless tobacco: Never  Substance and Sexual Activity   Alcohol use: Yes    Comment: daily   Drug use: No   Sexual activity: Not on file  Other Topics Concern   Not on file  Social History Narrative   Not on file   Social Determinants of Health   Financial Resource Strain: Not on file  Food Insecurity: No Food Insecurity (11/16/2020)   Received from [facility]   Hunger Vital Sign    Worried About Running Out of Food in the Last Year: Never true    Ran Out of Food in the Last Year: Never true  Transportation Needs: Not on file  Physical Activity: Not on file  Stress: Not on file  Social Connections: Unknown (06/19/2021)   [facility] from [facility]   Social Network    Social Network: Not on file   Family History: Family History  Problem Relation Age of Onset   Stroke Mother    Alcohol abuse Father    Seizures Father    Allergies: No Known Allergies Medications: See med rec.  Review of Systems: No headache, visual changes, nausea, vomiting, diarrhea, constipation, dizziness, abdominal pain, skin rash, fevers, chills, night sweats, swollen lymph nodes, weight loss, chest pain, body aches, joint  swelling, muscle aches, shortness of breath, mood changes, visual or auditory hallucinations.  Objective:    General: Well Developed, well nourished, and in no acute distress.  Neuro: Alert and oriented x3, extra-ocular muscles intact, sensation grossly intact. Cranial nerves II through XII are intact, motor, sensory, and coordinative functions are all intact. HEENT: Normocephalic, atraumatic, pupils equal round reactive to light, neck supple, no masses, no lymphadenopathy, thyroid nonpalpable. Oropharynx, nasopharynx, external ear canals are unremarkable. Skin: Warm and dry, no rashes noted.  Cardiac: Regular rate and rhythm, no murmurs rubs or gallops.  Respiratory: Clear to auscultation bilaterally. Not using accessory muscles, speaking in full sentences.  Abdominal: Soft, nontender, nondistended, positive bowel sounds, no masses, no organomegaly.  Musculoskeletal: Shoulder, elbow, wrist, hip, knee, ankle stable, and with full range of motion.  Impression and Recommendations:    The patient was counselled, risk factors were discussed, anticipatory guidance given.  Annual physical exam Physical as above, he will return for flu, tetanus, COVID vaccines. He will do a nurse visit and spread them out. He does not want to do them today because he has a trip coming up. He was concerned about his hemoglobin A1c, it is normal but it is 5.6%, we discussed the normal age-related increase in insulin resistance and low carbohydrate diet but no other changes are needed at this time. Return to see me in a  year.   ____________________________________________ Ernest Nguyen. Benjamin Stain, M.D., ABFM., CAQSM., AME. Primary Care and Sports Medicine [facility]  Adjunct Professor of Family Medicine  [employer]  Restaurant manager, fast food

## 2022-12-18 ENCOUNTER — Ambulatory Visit (INDEPENDENT_AMBULATORY_CARE_PROVIDER_SITE_OTHER): Payer: BC Managed Care – PPO

## 2022-12-18 VITALS — Temp 98.3°F

## 2022-12-18 DIAGNOSIS — Z23 Encounter for immunization: Secondary | ICD-10-CM | POA: Diagnosis not present

## 2022-12-18 NOTE — Progress Notes (Unsigned)
Pt here to have Tdap and Flu shot done. These immunizations were given pt tolerated well.

## 2022-12-20 ENCOUNTER — Ambulatory Visit: Payer: BC Managed Care – PPO

## 2023-01-21 ENCOUNTER — Ambulatory Visit: Payer: BC Managed Care – PPO

## 2023-01-21 ENCOUNTER — Encounter: Payer: Self-pay | Admitting: Emergency Medicine

## 2023-01-21 ENCOUNTER — Ambulatory Visit
Admission: EM | Admit: 2023-01-21 | Discharge: 2023-01-21 | Disposition: A | Discharge status: Home / Self Care | Payer: BC Managed Care – PPO | Attending: Family Medicine | Admitting: Family Medicine

## 2023-01-21 DIAGNOSIS — R058 Other specified cough: Secondary | ICD-10-CM | POA: Diagnosis not present

## 2023-01-21 DIAGNOSIS — J209 Acute bronchitis, unspecified: Secondary | ICD-10-CM | POA: Diagnosis not present

## 2023-01-21 DIAGNOSIS — R059 Cough, unspecified: Secondary | ICD-10-CM | POA: Diagnosis not present

## 2023-01-21 DIAGNOSIS — R042 Hemoptysis: Secondary | ICD-10-CM

## 2023-01-21 MED ORDER — DOXYCYCLINE HYCLATE 100 MG PO CAPS: 100.0000 mg | ORAL_CAPSULE | Freq: Two times a day (BID) | ORAL | 0 refills | Status: DC

## 2023-01-21 NOTE — ED Provider Notes (Signed)
[facility]    CSN: [id number] Arrival date & time: 01/21/23  0815      History   Chief Complaint Chief Complaint  Patient presents with   Cough    HPI Ernest Nguyen. is a 62 y.o. male.   HPI  Generally healthy 62 year old.  Non-smoker.  No asthma.  Has had a cough for 2-1/2 weeks.  He states that he is coughing up mucus.  This morning he coughed up mucus with blood streaks in it.  He is here for evaluation because of this.  Initially had some sinus drainage fever and chills but those have resolved.  He states his wife was treated last week for acute bronchitis.  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 No pertinent surgical history.     Home Medications    Prior to Admission medications   Medication Sig Start Date End Date Taking? Authorizing Provider  atorvastatin (LIPITOR) 10 MG tablet Take 1 tablet (10 mg total) by mouth daily. 03/05/22  Yes Monica Becton, MD  doxycycline (VIBRAMYCIN) 100 MG capsule Take 1 capsule (100 mg total) by mouth 2 (two) times daily. 01/21/23  Yes Eustace Moore, MD  tadalafil (CIALIS) 5 MG tablet Take 1 tablet (5 mg total) by mouth daily. 04/26/22  Yes Monica Becton, MD  tamsulosin (FLOMAX) 0.4 MG CAPS capsule Take 1 capsule (0.4 mg total) by mouth daily. 03/28/22  Yes Monica Becton, MD  zolpidem (AMBIEN) 10 MG tablet Take 1 tablet (10 mg total) by mouth at bedtime as needed. for sleep 04/26/22  Yes Monica Becton, MD    Family History Family History  Problem  Relation Age of Onset   Stroke Mother    Alcohol abuse Father    Seizures Father     Social History Social History   Tobacco Use   Smoking status: Never   Smokeless tobacco: Never  Vaping Use   Vaping status: Never Used  Substance Use Topics   Alcohol use: Yes    Comment: daily   Drug use: No     Allergies   Patient has no known allergies.   Review of Systems Review of Systems See HPI  Physical Exam Triage Vital Signs ED Triage Vitals  Encounter Vitals Group     BP 01/21/23 0829 (!) 162/81     Systolic BP Percentile --      Diastolic BP Percentile --      Pulse Rate 01/21/23 0829 77     Resp 01/21/23 0829 16     Temp 01/21/23 0829 98.4 F (36.9 C)     Temp Source 01/21/23 0829 Oral     SpO2 01/21/23 0829 99 %     Weight 01/21/23 0831 195 lb (88.5 kg)     Height 01/21/23 0831 6' (1.829 m)     Head Circumference --      Peak Flow --      Pain Score 01/21/23 0831 0     Pain Loc --      Pain Education --  Exclude from Growth Chart --    No data found.  Updated Vital Signs BP 127/83 (BP Location: Right Arm)   Pulse 70   Temp 98.4 F (36.9 C) (Oral)   Resp 16   Ht 6' (1.829 m)   Wt 88.5 kg   SpO2 97%   BMI 26.45 kg/m      Physical Exam Constitutional:      General: He is not in acute distress.    Appearance: He is well-developed and normal weight.  HENT:     Head: Normocephalic and atraumatic.     Right Ear: Tympanic membrane normal.     Left Ear: Tympanic membrane normal.     Nose: Nose normal.     Mouth/Throat:     Mouth: Mucous membranes are moist.     Pharynx: No posterior oropharyngeal erythema.  Eyes:     Conjunctiva/sclera: Conjunctivae normal.     Pupils: Pupils are equal, round, and reactive to light.  Cardiovascular:     Rate and Rhythm: Normal rate.     Heart sounds: Normal heart sounds.  Pulmonary:     Effort: Pulmonary effort is normal. No respiratory distress.     Breath sounds: Rales present.  Abdominal:     General:  There is no distension.     Palpations: Abdomen is soft.  Musculoskeletal:        General: Normal range of motion.     Cervical back: Normal range of motion.  Lymphadenopathy:     Cervical: No cervical adenopathy.  Skin:    General: Skin is warm and dry.  Neurological:     Mental Status: He is alert.      UC Treatments / Results  Labs (all labs ordered are listed, but only abnormal results are displayed) Labs Reviewed - No data to display  EKG   Radiology DG Chest 2 View Result Date: 01/21/2023 CLINICAL DATA:  Productive cough. EXAM: CHEST - 2 VIEW COMPARISON:  July 28, 2017. FINDINGS: The heart size and mediastinal contours are within normal limits. Both lungs are clear. The visualized skeletal structures are unremarkable. IMPRESSION: No active cardiopulmonary disease. Electronically Signed   By: Lupita Raider M.D.   On: 01/21/2023 11:22    Procedures Procedures (including critical care time)  Medications Ordered in UC Medications - No data to display  Initial Impression / Assessment and Plan / UC Course  I have reviewed the triage vital signs and the nursing notes.  Pertinent labs & imaging results that were available during my care of the patient were reviewed by me and considered in my medical decision making (see chart for details).     Patient was called with his radiology report Final Clinical Impressions(s) / UC Diagnoses   Final diagnoses:  Acute bronchitis, unspecified organism  Hemoptysis     Discharge Instructions      Take the doxycycline antibiotic 2 times a day.  It is important to take this antibiotic with food Continue to drink lots of fluids Call if not improving by the end of the week  I believe that your chest x-ray shows only bronchitis changes.  You will be called in as soon as the x-ray reading is available     ED Prescriptions     Medication Sig Dispense Auth. Provider   doxycycline (VIBRAMYCIN) 100 MG capsule Take 1 capsule  (100 mg total) by mouth 2 (two) times daily. 20 capsule Eustace Moore, MD      PDMP not  reviewed this encounter.   Eustace Moore, MD 01/21/23 1322

## 2023-01-21 NOTE — ED Triage Notes (Signed)
Patient c/o productive cough, nasal drainage and some SOB x 2 weeks.  Patient has taken OTC nasal congestion and Nyquil.

## 2023-01-21 NOTE — Discharge Instructions (Signed)
Take the doxycycline antibiotic 2 times a day.  It is important to take this antibiotic with food Continue to drink lots of fluids Call if not improving by the end of the week  I believe that your chest x-ray shows only bronchitis changes.  You will be called in as soon as the x-ray reading is available

## 2023-03-26 ENCOUNTER — Other Ambulatory Visit: Payer: Self-pay | Admitting: Sports Medicine

## 2023-03-26 DIAGNOSIS — E785 Hyperlipidemia, unspecified: Secondary | ICD-10-CM

## 2023-03-26 DIAGNOSIS — N138 Other obstructive and reflux uropathy: Secondary | ICD-10-CM

## 2023-04-08 ENCOUNTER — Other Ambulatory Visit: Payer: Self-pay | Admitting: Sports Medicine

## 2023-04-08 MED ORDER — NIRMATRELVIR/RITONAVIR (PAXLOVID)TABLET: ORAL_TABLET | ORAL | 0 refills | Status: DC

## 2023-04-08 NOTE — Telephone Encounter (Signed)
 Patient's wife diagnosed today with COVID, patient has no symptoms, I will write a Paxlovid prescription, he can hold onto this and start treatment if he develops any symptoms.

## 2023-04-25 ENCOUNTER — Other Ambulatory Visit: Payer: Self-pay | Admitting: Sports Medicine

## 2023-04-25 DIAGNOSIS — N138 Other obstructive and reflux uropathy: Secondary | ICD-10-CM

## 2023-06-25 ENCOUNTER — Other Ambulatory Visit: Payer: Self-pay | Admitting: Sports Medicine

## 2023-06-25 DIAGNOSIS — E785 Hyperlipidemia, unspecified: Secondary | ICD-10-CM

## 2023-07-08 ENCOUNTER — Other Ambulatory Visit: Payer: Self-pay | Admitting: Sports Medicine

## 2023-07-08 DIAGNOSIS — N401 Enlarged prostate with lower urinary tract symptoms: Secondary | ICD-10-CM

## 2023-07-19 DIAGNOSIS — W57XXXA Bitten or stung by nonvenomous insect and other nonvenomous arthropods, initial encounter: Secondary | ICD-10-CM | POA: Diagnosis not present

## 2023-07-19 DIAGNOSIS — R21 Rash and other nonspecific skin eruption: Secondary | ICD-10-CM | POA: Diagnosis not present

## 2023-07-19 DIAGNOSIS — S80862A Insect bite (nonvenomous), left lower leg, initial encounter: Secondary | ICD-10-CM | POA: Diagnosis not present

## 2023-07-22 ENCOUNTER — Other Ambulatory Visit: Payer: Self-pay | Admitting: Sports Medicine

## 2023-07-22 DIAGNOSIS — N138 Other obstructive and reflux uropathy: Secondary | ICD-10-CM

## 2023-09-17 ENCOUNTER — Encounter: Payer: Self-pay | Admitting: Sports Medicine

## 2023-09-17 DIAGNOSIS — Z1211 Encounter for screening for malignant neoplasm of colon: Secondary | ICD-10-CM

## 2023-09-17 NOTE — Telephone Encounter (Signed)
 Cologuard ordered and patient informed

## 2023-09-22 DIAGNOSIS — Z1211 Encounter for screening for malignant neoplasm of colon: Secondary | ICD-10-CM | POA: Diagnosis not present

## 2023-09-22 DIAGNOSIS — Z1212 Encounter for screening for malignant neoplasm of rectum: Secondary | ICD-10-CM | POA: Diagnosis not present

## 2023-09-27 LAB — COLOGUARD: COLOGUARD: NEGATIVE

## 2023-09-29 ENCOUNTER — Ambulatory Visit: Payer: Self-pay | Admitting: Family Medicine

## 2023-09-29 NOTE — Progress Notes (Signed)
 Great news! Your Cologuard test is negative.  Recommend repeat colon cancer screening in 3 years.

## 2023-10-07 ENCOUNTER — Encounter: Payer: Self-pay | Admitting: Sports Medicine

## 2023-11-06 ENCOUNTER — Other Ambulatory Visit: Payer: Self-pay | Admitting: Sports Medicine

## 2023-11-06 DIAGNOSIS — N401 Enlarged prostate with lower urinary tract symptoms: Secondary | ICD-10-CM

## 2023-11-06 NOTE — Telephone Encounter (Signed)
 Copied from CRM [id number]. Topic: Clinical - Medication Refill >> Nov 06, 2023  3:26 PM Antony S wrote: Medication:  tadalafil  (CIALIS ) 5 MG tablet   Has the patient contacted their pharmacy? Yes (Agent: If no, request that the patient contact the pharmacy for the refill. If patient does not wish to contact the pharmacy document the reason why and proceed with request.) (Agent: If yes, when and what did the pharmacy advise?)  This is the patient's preferred pharmacy:  [facility] # [address], [place] - [address] [place] [address] Phone: [phone] Fax: [phone]    Is this the correct pharmacy for this prescription? Yes If no, delete pharmacy and type the correct one.   Has the prescription been filled recently? No  Is the patient out of the medication? No  Has the patient been seen for an appointment in the last year OR does the patient have an upcoming appointment? Yes  Can we respond through MyChart? Yes  Agent: Please be advised that Rx refills may take up to 3 business days. We ask that you follow-up with your pharmacy.

## 2023-11-11 ENCOUNTER — Telehealth: Payer: Self-pay

## 2023-11-11 MED ORDER — TADALAFIL 5 MG PO TABS: 5.0000 mg | ORAL_TABLET | Freq: Every day | ORAL | 0 refills | Status: DC

## 2023-11-11 NOTE — Telephone Encounter (Signed)
 Copied from CRM [id number]. Topic: Clinical - Medication Refill >> Nov 06, 2023  3:26 PM Antony S wrote:  Medication:  tadalafil  (CIALIS ) 5 MG tablet   Has the patient contacted their pharmacy? Yes (Agent: If no, request that the patient contact the pharmacy for the refill. If patient does not wish to contact the pharmacy document the reason why and proceed with request.) (Agent: If yes, when and what did the pharmacy advise?)  This is the patient's preferred pharmacy:  [facility] # [address], [place] - [address] [place] [address] Phone: [phone] Fax: [phone]    Is this the correct pharmacy for this prescription? Yes If no, delete pharmacy and type the correct one.   Has the prescription been filled recently? No  Is the patient out of the medication? No  Has the patient been seen for an appointment in the last year OR does the patient have an upcoming appointment? Yes  Can we respond through MyChart? Yes  Agent: Please be advised that Rx refills may take up to 3 business days. We ask that you follow-up with your pharmacy. >> Nov 10, 2023  4:21 PM Corin V wrote: Patient is calling to follow up on refill. He is now out of the script. Please send to [facility] as today is the 3rd business day

## 2023-11-11 NOTE — Telephone Encounter (Signed)
 Last filled 07/23/2023  Last OV 12/18/2022

## 2023-11-11 NOTE — Telephone Encounter (Signed)
 Second refill request -   Last filled on 07/23/22  Last OV on 12/18/22 (annual physical)  Is it appropriate to refill the Tadalafil ? Please advise, thanks.

## 2023-11-11 NOTE — Telephone Encounter (Signed)
 Please Advise    Copied from CRM [id number]. Topic: Clinical - Medication Refill >> Nov 06, 2023  3:26 PM Antony S wrote: Medication:  tadalafil  (CIALIS ) 5 MG tablet   Has the patient contacted their pharmacy? Yes (Agent: If no, request that the patient contact the pharmacy for the refill. If patient does not wish to contact the pharmacy document the reason why and proceed with request.) (Agent: If yes, when and what did the pharmacy advise?)  This is the patient's preferred pharmacy:  [facility] # [address], [place] - [address] [place] [address] Phone: [phone] Fax: [phone]    Is this the correct pharmacy for this prescription? Yes If no, delete pharmacy and type the correct one.   Has the prescription been filled recently? No  Is the patient out of the medication? No  Has the patient been seen for an appointment in the last year OR does the patient have an upcoming appointment? Yes  Can we respond through MyChart? Yes  Agent: Please be advised that Rx refills may take up to 3 business days. We ask that you follow-up with your pharmacy. >> Nov 10, 2023  4:21 PM Corin V wrote: Patient is calling to follow up on refill. He is now out of the script. Please send to [facility] as today is the 3rd business day

## 2023-12-03 ENCOUNTER — Ambulatory Visit (INDEPENDENT_AMBULATORY_CARE_PROVIDER_SITE_OTHER)

## 2023-12-03 VITALS — Temp 98.5°F

## 2023-12-03 DIAGNOSIS — Z23 Encounter for immunization: Secondary | ICD-10-CM | POA: Diagnosis not present

## 2023-12-05 ENCOUNTER — Ambulatory Visit (INDEPENDENT_AMBULATORY_CARE_PROVIDER_SITE_OTHER)

## 2023-12-05 DIAGNOSIS — Z23 Encounter for immunization: Secondary | ICD-10-CM | POA: Diagnosis not present

## 2023-12-29 ENCOUNTER — Other Ambulatory Visit: Payer: Self-pay

## 2023-12-29 ENCOUNTER — Other Ambulatory Visit: Payer: Self-pay | Admitting: Urgent Care

## 2023-12-29 DIAGNOSIS — N401 Enlarged prostate with lower urinary tract symptoms: Secondary | ICD-10-CM

## 2023-12-29 MED ORDER — TAMSULOSIN HCL 0.4 MG PO CAPS
0.4000 mg | ORAL_CAPSULE | Freq: Every day | ORAL | 0 refills | Status: AC
Start: 2023-12-29 — End: ?

## 2023-12-29 NOTE — Telephone Encounter (Signed)
 Spoke with patient, he plans to change PCP but will run out prior to his initial visit. Refill sent to pharmacy for continuity of care.

## 2023-12-29 NOTE — Progress Notes (Signed)
 Refill request for atorvastatin  received. Denied refill as pt has not been in our office in over one year. Appointment is needed.

## 2023-12-30 ENCOUNTER — Other Ambulatory Visit: Payer: Self-pay

## 2023-12-30 DIAGNOSIS — E785 Hyperlipidemia, unspecified: Secondary | ICD-10-CM

## 2023-12-30 NOTE — Telephone Encounter (Signed)
 Copied from CRM [id number]. Topic: Clinical - Medication Refill >> Dec 30, 2023 12:17 PM Aleatha C wrote: Medication: atorvastatin  (LIPITOR) 10 MG tablet  Has the patient contacted their pharmacy? Yes (Agent: If no, request that the patient contact the pharmacy for the refill. If patient does not wish to contact the pharmacy document the reason why and proceed with request.) (Agent: If yes, when and what did the pharmacy advise?)  This is the patient's preferred pharmacy:  [facility] # [address] [place] - [address] [place] [address] Phone: [phone] Fax: [phone]     Is this the correct pharmacy for this prescription? Yes If no, delete pharmacy and type the correct one.   Has the prescription been filled recently? No  Is the patient out of the medication? Yes  Has the patient been seen for an appointment in the last year OR does the patient have an upcoming appointment? Yes  Can we respond through MyChart? No  Agent: Please be advised that Rx refills may take up to 3 business days. We ask that you follow-up with your pharmacy.

## 2024-01-06 ENCOUNTER — Ambulatory Visit (INDEPENDENT_AMBULATORY_CARE_PROVIDER_SITE_OTHER): Admitting: Student in an Organized Health Care Education/Training Program

## 2024-01-06 ENCOUNTER — Encounter: Payer: Self-pay | Admitting: Student in an Organized Health Care Education/Training Program

## 2024-01-06 VITALS — BP 131/83 | HR 62 | Ht 71.8 in | Wt 197.0 lb

## 2024-01-06 DIAGNOSIS — N138 Other obstructive and reflux uropathy: Secondary | ICD-10-CM | POA: Diagnosis not present

## 2024-01-06 DIAGNOSIS — N401 Enlarged prostate with lower urinary tract symptoms: Secondary | ICD-10-CM | POA: Diagnosis not present

## 2024-01-06 DIAGNOSIS — I10 Essential (primary) hypertension: Secondary | ICD-10-CM | POA: Diagnosis not present

## 2024-01-06 DIAGNOSIS — F5101 Primary insomnia: Secondary | ICD-10-CM | POA: Diagnosis not present

## 2024-01-06 DIAGNOSIS — E785 Hyperlipidemia, unspecified: Secondary | ICD-10-CM

## 2024-01-06 DIAGNOSIS — Z0000 Encounter for general adult medical examination without abnormal findings: Secondary | ICD-10-CM | POA: Diagnosis not present

## 2024-01-06 DIAGNOSIS — G4700 Insomnia, unspecified: Secondary | ICD-10-CM | POA: Diagnosis not present

## 2024-01-06 LAB — BASIC METABOLIC PANEL WITH GFR
BUN: 13 mg/dL (ref 6–23)
CO2: 31 meq/L (ref 19–32)
Calcium: 9.9 mg/dL (ref 8.4–10.5)
Chloride: 101 meq/L (ref 96–112)
Creatinine, Ser: 1.01 mg/dL (ref 0.40–1.50)
GFR: 78.77 mL/min (ref >60.00)
Glucose, Bld: 92 mg/dL (ref 70–99)
Potassium: 4.3 meq/L (ref 3.5–5.1)
Sodium: 138 meq/L (ref 135–145)

## 2024-01-06 LAB — LIPID PANEL
Cholesterol: 141 mg/dL (ref 0–200)
HDL: 65 mg/dL (ref >39.00)
LDL Cholesterol: 68 mg/dL (ref 0–99)
NonHDL: 75.66
Total CHOL/HDL Ratio: 2
Triglycerides: 38 mg/dL (ref 0.0–149.0)
VLDL: 7.6 mg/dL (ref 0.0–40.0)

## 2024-01-06 LAB — PSA: PSA: 1.11 ng/mL (ref 0.10–4.00)

## 2024-01-06 MED ORDER — FINASTERIDE 5 MG PO TABS: 5.0000 mg | ORAL_TABLET | Freq: Every day | ORAL | 0 refills | Status: AC

## 2024-01-06 MED ORDER — FINASTERIDE 5 MG PO TABS: 5.0000 mg | ORAL_TABLET | Freq: Every day | ORAL | 1 refills | Status: DC

## 2024-01-06 MED ORDER — ZOLPIDEM TARTRATE 10 MG PO TABS: 10.0000 mg | ORAL_TABLET | Freq: Every evening | ORAL | 0 refills | Status: AC | PRN

## 2024-01-06 MED ORDER — ATORVASTATIN CALCIUM 10 MG PO TABS: 10.0000 mg | ORAL_TABLET | Freq: Every day | ORAL | 0 refills | Status: DC

## 2024-01-06 NOTE — Assessment & Plan Note (Addendum)
 Chronic BPH with significant LUTS persists despite current medications with Flomax  and near daily Cialis . Discussed risks of untreated BPH. Finasteride  may offer partial relief. Prescribed finasteride  and referred to urology for surgical consultation.  He has a pretty large burden of lower urinary tract symptoms and I suspect the addition of finasteride  may only be a marginal benefit, likely not to be fully satisfying.  We talked about the low risk of worsening sexual function with finasteride .  Ordered blood work for PSA, kidney function, and cholesterol.

## 2024-01-06 NOTE — Assessment & Plan Note (Signed)
 Seems to be a new issue.  Blood pressure today consistent with stage I hypertension.  Otherwise very healthy individual so I do not think there is much benefit to antihypertensive at this point.  Will monitor and may offer antihypertensive in the future.

## 2024-01-06 NOTE — Assessment & Plan Note (Signed)
 Engages in occasional physical activity. Cancer screenings are up to date for PSA and colon cancer (Cologuard), but not all screenings are current. Alcohol consumption is low-risk, and he does not use tobacco. Discussed the importance of strength training. Continue current exercise routine and consider strength training. Maintain low-risk alcohol consumption and continue regular cancer screenings as per guidelines.

## 2024-01-06 NOTE — Assessment & Plan Note (Signed)
 Chronic and stable.  I think his sleep disturbance is likely related to frequent nocturia which we are trying to work on.  At times he uses zolpidem , but says very rarely.  No adverse side effects.  Will prescribe #15 tablets and expected to last probably around 3 months.

## 2024-01-06 NOTE — Patient Instructions (Signed)
  VISIT SUMMARY: You had your annual physical exam today. We discussed your frequent urination, sleep disturbances, shoulder and elbow pain, cholesterol management, and general health maintenance. We also reviewed your current medications and lifestyle habits.  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 Your symptoms persist despite current medications, so we discussed the risks of untreated BPH. I prescribed finasteride , which may help reduce the size of your prostate, and referred you to a urologist for a surgical consultation. We also ordered blood work to check your PSA, kidney function, and cholesterol levels.  -HYPERLIPIDEMIA: Hyperlipidemia means you have high cholesterol levels. Your cholesterol is currently managed with medication, but we ordered blood work to check your levels and ensure they remain controlled.  -INSOMNIA: Insomnia is difficulty falling or staying asleep. Your intermittent insomnia is managed with infrequent use of zolpidem  (Ambien ). You reported drowsiness and vivid dreams as side effects.  -TINNITUS: Tinnitus is a ringing or buzzing noise in one or both ears. Your tinnitus is intermittent and likely age-related, with no significant impact on your daily activities.  -GENERAL HEALTH MAINTENANCE: You are engaging in occasional physical activity and your cancer screenings for PSA and colon cancer are up to date. We discussed the importance of strength training and maintaining your current exercise routine. Your alcohol consumption is low-risk, and you do not use tobacco. Continue regular cancer screenings as per guidelines.  INSTRUCTIONS: Please follow up with the urologist for a surgical consultation regarding your BPH. Additionally, complete the blood work for PSA, kidney function, and cholesterol levels as ordered. Continue your current medications and lifestyle habits,  and consider incorporating strength training into your exercise routine.

## 2024-01-06 NOTE — Assessment & Plan Note (Addendum)
 Chronic and stable, tolerating atorvastatin  well. Ordered blood work for cholesterol levels.  This is primary prevention.  Patient is on his way to [place] , asked me to send a short supply of atorvastatin  there to use during his vacation.

## 2024-01-06 NOTE — Progress Notes (Signed)
 Complete physical exam  Patient: Ernest NguyenMale    DOB: [birth year] 63 y.o.   MRN: [id number]  Chief Complaint  Patient presents with   Transitions Of Care    Physical   Patient would like Lipitor [facility]      Subjective:    Ernest Prowell Jr. is a 63 y.o. male who presents today for a complete physical exam. He reports consuming a general diet. Golfs regularly for exercise. He generally feels well. He reports sleeping fairly well. He does have additional problems to discuss today.   Discussed the use of AI scribe software for clinical note transcription with the patient, who gave verbal consent to proceed.  History of Present Illness Ernest Kau Jr. T is a 63 year old male who presents for an annual physical exam.  He experiences frequent urination, particularly nocturia, occurring three to four times per night, which disrupts his sleep. This has been ongoing for a couple of years. He has a strong urge to urinate but denies weak stream, start or stop issues, or leakage. His PSA levels have been normal. He has been using tamsulosin  for about eight years. He also takes Cialis  daily.  He experiences sleep disturbances due to frequent urination. He uses Ambien  sparingly, less than ten times a year, primarily when he has difficulty sleeping for several days. He reports drowsiness and weird dreams as side effects of Ambien .  He has a history of back and shoulder issues, with shoulder pain affecting his sleep. He was close to having shoulder surgery but canceled it. He also reports occasional elbow pain.  He has been on cholesterol medication for about five years after his cholesterol levels were slightly elevated in the low 200s. He has no history of heart attack or stroke.  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 He has two adult children and two grandchildren. He enjoys golfing as his primary form of exercise,  although he acknowledges he should be more active. He has experienced weight loss since retirement, attributed to increased activity and dietary changes.  He drinks alcohol regularly, typically one to two drinks with dinner daily, and denies tobacco use. He reports occasional tinnitus but no significant hearing loss.  He is up to date on his cancer screenings, including Cologuard and PSA tests.   Most recent fall risk assessment:    01/06/2024    8:15 AM  Fall Risk   Falls in the past year? 0  Number falls in past yr: 0  Injury with Fall? 0  Risk for fall due to : No Fall Risks  Follow up Falls evaluation completed     Most recent depression screenings:    01/06/2024    8:15 AM 12/10/2022    9:58 AM  PHQ 2/9 Scores  PHQ - 2 Score 0 0  PHQ- 9 Score 3     Patient Care Team: Jerrell Cleatus Ned, MD as PCP - General (Internal Medicine)      Objective:    BP 131/83   Pulse 62   Ht 5' 11.8 (1.824 m)   Wt 197 lb (89.4 kg)   SpO2 97%   BMI 26.87 kg/m   Physical Exam   Gen: Well-appearing man Ears: Hearing normal, ear canals normal, normal tympanic membranes Neck: Normal thyroid , no nodules or adenopathy Heart: Regular, no murmur Lungs: Unlabored, clear throughout Abd: Soft, palpable bladder to below the umbilicus, nontender Ext:  Warm, no edema, normal joints      Assessment & Plan:    Routine Health Maintenance and Physical Exam Immunization History  Administered Date(s) Administered   Influenza, Seasonal, Injecte, Preservative Fre 12/18/2022, 12/03/2023   Influenza,inj,Quad PF,6+ Mos 12/06/2016, 12/01/2017, 11/11/2018, 11/26/2019   Influenza-Unspecified 11/25/2020   PFIZER(Purple Top)SARS-COV-2 Vaccination 04/11/2019, 05/02/2019, 01/25/2020, 11/25/2020   Pfizer(Comirnaty)Fall Seasonal Vaccine 12 years and older 12/05/2023   Tdap 12/19/2011, 12/18/2022   Zoster Recombinant(Shingrix ) 11/26/2019, 04/13/2020    Health Maintenance  Topic Date Due    Pneumococcal Vaccine: 50+ Years (1 of 1 - PCV) Never done   Fecal DNA (Cologuard)  09/22/2026   DTaP/Tdap/Td (3 - Td or Tdap) 12/17/2032   Influenza Vaccine  Completed   COVID-19 Vaccine  Completed   Hepatitis C Screening  Completed   HIV Screening  Completed   Zoster Vaccines- Shingrix   Completed   Hepatitis B Vaccines 19-59 Average Risk  Aged Out   HPV VACCINES  Aged Out   Meningococcal B Vaccine  Aged Out   Colonoscopy  Discontinued    Discussed health benefits of physical activity, and encouraged him to engage in regular exercise appropriate for his age and condition.  In addition to a health maintenance physical exam, a separate encounter was completed to address acute issues of worsening urinary tract symptoms.  Problem List Items Addressed This Visit       High   BPH with obstruction/lower urinary tract symptoms (Chronic)   Chronic BPH with significant LUTS persists despite current medications with Flomax  and near daily Cialis . Discussed risks of untreated BPH. Finasteride  may offer partial relief. Prescribed finasteride  and referred to urology for surgical consultation.  He has a pretty large burden of lower urinary tract symptoms and I suspect the addition of finasteride  may only be a marginal benefit, likely not to be fully satisfying.  We talked about the low risk of worsening sexual function with finasteride .  Ordered blood work for PSA, kidney function, and cholesterol.      Relevant Medications   finasteride  (PROSCAR ) 5 MG tablet   Other Relevant Orders   Ambulatory referral to Urology   PSA     Medium    Insomnia (Chronic)   Chronic and stable.  I think his sleep disturbance is likely related to frequent nocturia which we are trying to work on.  At times he uses zolpidem , but says very rarely.  No adverse side effects.  Will prescribe #15 tablets and expected to last probably around 3 months.      Relevant Medications   zolpidem  (AMBIEN ) 10 MG tablet    Hyperlipidemia (Chronic)   Chronic and stable, tolerating atorvastatin  well. Ordered blood work for cholesterol levels.  This is primary prevention.  Patient is on his way to [place] , asked me to send a short supply of atorvastatin  there to use during his vacation.      Relevant Orders   Lipid panel   Hypertension (Chronic)   Seems to be a new issue.  Blood pressure today consistent with stage I hypertension.  Otherwise very healthy individual so I do not think there is much benefit to antihypertensive at this point.  Will monitor and may offer antihypertensive in the future.      Relevant Orders   Basic metabolic panel with GFR     Low   Health maintenance examination - Primary (Chronic)   Engages in occasional physical activity. Cancer screenings are up to date for PSA and colon cancer (Cologuard), but  not all screenings are current. Alcohol consumption is low-risk, and he does not use tobacco. Discussed the importance of strength training. Continue current exercise routine and consider strength training. Maintain low-risk alcohol consumption and continue regular cancer screenings as per guidelines.       Return in about 3 months (around 04/05/2024).    Cleatus Debby Specking, MD [facility]

## 2024-01-07 ENCOUNTER — Ambulatory Visit: Payer: Self-pay | Admitting: Student in an Organized Health Care Education/Training Program

## 2024-01-31 ENCOUNTER — Other Ambulatory Visit: Payer: Self-pay | Admitting: Family Medicine

## 2024-01-31 ENCOUNTER — Other Ambulatory Visit: Payer: Self-pay | Admitting: Student in an Organized Health Care Education/Training Program

## 2024-01-31 DIAGNOSIS — E785 Hyperlipidemia, unspecified: Secondary | ICD-10-CM

## 2024-01-31 DIAGNOSIS — N401 Enlarged prostate with lower urinary tract symptoms: Secondary | ICD-10-CM

## 2024-02-04 ENCOUNTER — Other Ambulatory Visit: Payer: Self-pay | Admitting: Family Medicine

## 2024-02-04 DIAGNOSIS — N138 Other obstructive and reflux uropathy: Secondary | ICD-10-CM

## 2024-02-06 ENCOUNTER — Other Ambulatory Visit: Payer: Self-pay

## 2024-02-06 ENCOUNTER — Other Ambulatory Visit: Payer: Self-pay | Admitting: Student in an Organized Health Care Education/Training Program

## 2024-02-06 DIAGNOSIS — N138 Other obstructive and reflux uropathy: Secondary | ICD-10-CM

## 2024-02-06 NOTE — Telephone Encounter (Signed)
 Okay to refill under your name?

## 2024-02-26 ENCOUNTER — Other Ambulatory Visit: Payer: Self-pay | Admitting: Student in an Organized Health Care Education/Training Program

## 2024-02-26 ENCOUNTER — Ambulatory Visit: Admitting: Urology

## 2024-02-26 DIAGNOSIS — E785 Hyperlipidemia, unspecified: Secondary | ICD-10-CM

## 2024-03-24 ENCOUNTER — Ambulatory Visit: Admitting: Urology

## 2024-03-31 ENCOUNTER — Ambulatory Visit: Admitting: Urology

## 2024-04-05 ENCOUNTER — Ambulatory Visit: Admitting: Student in an Organized Health Care Education/Training Program
